# Patient Record
Sex: Female | Born: 1945 | Race: White | Hispanic: No | Smoking: Never smoker
Health system: Southern US, Community
[De-identification: ages and names within clinical notes are randomized; demographics above are authoritative.]

## PROBLEM LIST (undated history)

## (undated) DIAGNOSIS — C801 Malignant (primary) neoplasm, unspecified: Secondary | ICD-10-CM

---

## 2010-08-03 DIAGNOSIS — C8218 Follicular lymphoma grade II, lymph nodes of multiple sites: Secondary | ICD-10-CM | POA: Insufficient documentation

## 2010-08-08 ENCOUNTER — Other Ambulatory Visit (HOSPITAL_COMMUNITY)
Admission: RE | Admit: 2010-08-08 | Discharge: 2010-08-08 | Disposition: A | Discharge status: Home / Self Care | Payer: Medicare Other | Source: Ambulatory Visit | Attending: *Deleted | Admitting: *Deleted

## 2010-08-08 DIAGNOSIS — R599 Enlarged lymph nodes, unspecified: Secondary | ICD-10-CM | POA: Insufficient documentation

## 2010-08-14 ENCOUNTER — Other Ambulatory Visit (HOSPITAL_COMMUNITY): Payer: Self-pay | Admitting: Oncology

## 2010-08-14 DIAGNOSIS — C8590 Non-Hodgkin lymphoma, unspecified, unspecified site: Secondary | ICD-10-CM

## 2010-08-16 ENCOUNTER — Other Ambulatory Visit: Payer: Self-pay | Admitting: Oncology

## 2010-08-16 ENCOUNTER — Other Ambulatory Visit (HOSPITAL_COMMUNITY)
Admission: RE | Admit: 2010-08-16 | Discharge: 2010-08-16 | Disposition: A | Discharge status: Home / Self Care | Payer: Medicare Other | Source: Ambulatory Visit | Attending: Oncology | Admitting: Oncology

## 2010-08-16 DIAGNOSIS — C8589 Other specified types of non-Hodgkin lymphoma, extranodal and solid organ sites: Secondary | ICD-10-CM | POA: Insufficient documentation

## 2010-08-22 ENCOUNTER — Encounter (HOSPITAL_COMMUNITY)
Admission: RE | Admit: 2010-08-22 | Discharge: 2010-08-22 | Disposition: A | Discharge status: Home / Self Care | Payer: Medicare Other | Source: Ambulatory Visit | Attending: Oncology | Admitting: Oncology

## 2010-08-22 ENCOUNTER — Encounter (HOSPITAL_COMMUNITY): Payer: Self-pay

## 2010-08-22 DIAGNOSIS — J9383 Other pneumothorax: Secondary | ICD-10-CM | POA: Insufficient documentation

## 2010-08-22 DIAGNOSIS — R599 Enlarged lymph nodes, unspecified: Secondary | ICD-10-CM | POA: Insufficient documentation

## 2010-08-22 DIAGNOSIS — C8589 Other specified types of non-Hodgkin lymphoma, extranodal and solid organ sites: Secondary | ICD-10-CM | POA: Insufficient documentation

## 2010-08-22 DIAGNOSIS — Z79899 Other long term (current) drug therapy: Secondary | ICD-10-CM | POA: Insufficient documentation

## 2010-08-22 DIAGNOSIS — C8590 Non-Hodgkin lymphoma, unspecified, unspecified site: Secondary | ICD-10-CM

## 2010-08-22 HISTORY — DX: Malignant (primary) neoplasm, unspecified: C80.1

## 2010-08-22 LAB — GLUCOSE, CAPILLARY: Glucose-Capillary: 99 mg/dL (ref 70–99)

## 2010-08-22 MED ORDER — FLUDEOXYGLUCOSE F - 18 (FDG) INJECTION
18.0000 | Freq: Once | INTRAVENOUS | Status: AC | PRN
  Administered 2010-08-22: 18 via INTRAVENOUS

## 2014-10-07 ENCOUNTER — Other Ambulatory Visit: Payer: Self-pay

## 2015-07-29 DIAGNOSIS — C8218 Follicular lymphoma grade II, lymph nodes of multiple sites: Secondary | ICD-10-CM | POA: Diagnosis not present

## 2016-01-05 DIAGNOSIS — Z9889 Other specified postprocedural states: Secondary | ICD-10-CM

## 2016-01-05 DIAGNOSIS — I472 Ventricular tachycardia: Secondary | ICD-10-CM

## 2016-01-05 DIAGNOSIS — R531 Weakness: Secondary | ICD-10-CM

## 2016-01-05 DIAGNOSIS — J690 Pneumonitis due to inhalation of food and vomit: Secondary | ICD-10-CM | POA: Diagnosis not present

## 2016-01-05 DIAGNOSIS — E860 Dehydration: Secondary | ICD-10-CM

## 2016-01-05 DIAGNOSIS — R740 Nonspecific elevation of levels of transaminase and lactic acid dehydrogenase [LDH]: Secondary | ICD-10-CM

## 2016-01-06 DIAGNOSIS — E860 Dehydration: Secondary | ICD-10-CM | POA: Diagnosis not present

## 2016-01-06 DIAGNOSIS — J690 Pneumonitis due to inhalation of food and vomit: Secondary | ICD-10-CM | POA: Diagnosis not present

## 2016-01-06 DIAGNOSIS — R531 Weakness: Secondary | ICD-10-CM | POA: Diagnosis not present

## 2016-01-06 DIAGNOSIS — Z9889 Other specified postprocedural states: Secondary | ICD-10-CM | POA: Diagnosis not present

## 2016-01-07 DIAGNOSIS — D649 Anemia, unspecified: Secondary | ICD-10-CM

## 2016-01-07 DIAGNOSIS — E860 Dehydration: Secondary | ICD-10-CM | POA: Diagnosis not present

## 2016-01-07 DIAGNOSIS — J690 Pneumonitis due to inhalation of food and vomit: Secondary | ICD-10-CM | POA: Diagnosis not present

## 2016-01-07 DIAGNOSIS — Z9889 Other specified postprocedural states: Secondary | ICD-10-CM | POA: Diagnosis not present

## 2016-01-07 DIAGNOSIS — R531 Weakness: Secondary | ICD-10-CM | POA: Diagnosis not present

## 2016-01-08 DIAGNOSIS — R531 Weakness: Secondary | ICD-10-CM | POA: Diagnosis not present

## 2016-01-08 DIAGNOSIS — E860 Dehydration: Secondary | ICD-10-CM | POA: Diagnosis not present

## 2016-01-08 DIAGNOSIS — J690 Pneumonitis due to inhalation of food and vomit: Secondary | ICD-10-CM | POA: Diagnosis not present

## 2016-01-08 DIAGNOSIS — Z9889 Other specified postprocedural states: Secondary | ICD-10-CM | POA: Diagnosis not present

## 2016-01-09 DIAGNOSIS — E860 Dehydration: Secondary | ICD-10-CM | POA: Diagnosis not present

## 2016-01-09 DIAGNOSIS — Z9889 Other specified postprocedural states: Secondary | ICD-10-CM | POA: Diagnosis not present

## 2016-01-09 DIAGNOSIS — R531 Weakness: Secondary | ICD-10-CM | POA: Diagnosis not present

## 2016-01-09 DIAGNOSIS — J690 Pneumonitis due to inhalation of food and vomit: Secondary | ICD-10-CM | POA: Diagnosis not present

## 2016-02-18 DIAGNOSIS — R531 Weakness: Secondary | ICD-10-CM

## 2016-02-18 DIAGNOSIS — M4646 Discitis, unspecified, lumbar region: Secondary | ICD-10-CM | POA: Diagnosis not present

## 2016-02-18 DIAGNOSIS — M4626 Osteomyelitis of vertebra, lumbar region: Secondary | ICD-10-CM | POA: Diagnosis not present

## 2016-02-18 DIAGNOSIS — D649 Anemia, unspecified: Secondary | ICD-10-CM | POA: Diagnosis not present

## 2016-02-19 DIAGNOSIS — M4646 Discitis, unspecified, lumbar region: Secondary | ICD-10-CM | POA: Diagnosis not present

## 2016-02-19 DIAGNOSIS — M4626 Osteomyelitis of vertebra, lumbar region: Secondary | ICD-10-CM | POA: Diagnosis not present

## 2016-02-19 DIAGNOSIS — D649 Anemia, unspecified: Secondary | ICD-10-CM | POA: Diagnosis not present

## 2016-02-19 DIAGNOSIS — R531 Weakness: Secondary | ICD-10-CM | POA: Diagnosis not present

## 2016-02-20 DIAGNOSIS — R531 Weakness: Secondary | ICD-10-CM | POA: Diagnosis not present

## 2016-02-20 DIAGNOSIS — D649 Anemia, unspecified: Secondary | ICD-10-CM | POA: Diagnosis not present

## 2016-02-20 DIAGNOSIS — M4626 Osteomyelitis of vertebra, lumbar region: Secondary | ICD-10-CM | POA: Diagnosis not present

## 2016-02-20 DIAGNOSIS — M4646 Discitis, unspecified, lumbar region: Secondary | ICD-10-CM | POA: Diagnosis not present

## 2016-02-21 DIAGNOSIS — D649 Anemia, unspecified: Secondary | ICD-10-CM | POA: Diagnosis not present

## 2016-02-21 DIAGNOSIS — M4626 Osteomyelitis of vertebra, lumbar region: Secondary | ICD-10-CM | POA: Diagnosis not present

## 2016-02-21 DIAGNOSIS — M4646 Discitis, unspecified, lumbar region: Secondary | ICD-10-CM | POA: Diagnosis not present

## 2016-02-21 DIAGNOSIS — R531 Weakness: Secondary | ICD-10-CM | POA: Diagnosis not present

## 2016-02-22 DIAGNOSIS — F329 Major depressive disorder, single episode, unspecified: Secondary | ICD-10-CM

## 2016-02-22 DIAGNOSIS — E039 Hypothyroidism, unspecified: Secondary | ICD-10-CM

## 2016-02-22 DIAGNOSIS — F419 Anxiety disorder, unspecified: Secondary | ICD-10-CM

## 2016-02-22 DIAGNOSIS — I10 Essential (primary) hypertension: Secondary | ICD-10-CM

## 2016-02-22 DIAGNOSIS — M4646 Discitis, unspecified, lumbar region: Secondary | ICD-10-CM

## 2016-02-22 DIAGNOSIS — G3184 Mild cognitive impairment, so stated: Secondary | ICD-10-CM

## 2016-02-22 DIAGNOSIS — D649 Anemia, unspecified: Secondary | ICD-10-CM

## 2016-02-22 DIAGNOSIS — M4626 Osteomyelitis of vertebra, lumbar region: Secondary | ICD-10-CM

## 2016-02-22 DIAGNOSIS — N179 Acute kidney failure, unspecified: Secondary | ICD-10-CM

## 2016-02-22 DIAGNOSIS — Z9889 Other specified postprocedural states: Secondary | ICD-10-CM

## 2016-02-22 DIAGNOSIS — R5381 Other malaise: Secondary | ICD-10-CM

## 2016-02-22 DIAGNOSIS — E785 Hyperlipidemia, unspecified: Secondary | ICD-10-CM

## 2016-02-23 DIAGNOSIS — M4626 Osteomyelitis of vertebra, lumbar region: Secondary | ICD-10-CM | POA: Diagnosis not present

## 2016-02-23 DIAGNOSIS — M4646 Discitis, unspecified, lumbar region: Secondary | ICD-10-CM | POA: Diagnosis not present

## 2016-02-23 DIAGNOSIS — F419 Anxiety disorder, unspecified: Secondary | ICD-10-CM | POA: Diagnosis not present

## 2016-02-23 DIAGNOSIS — F329 Major depressive disorder, single episode, unspecified: Secondary | ICD-10-CM | POA: Diagnosis not present

## 2016-03-27 ENCOUNTER — Encounter: Payer: Self-pay | Admitting: Internal Medicine

## 2016-03-27 ENCOUNTER — Ambulatory Visit (INDEPENDENT_AMBULATORY_CARE_PROVIDER_SITE_OTHER): Payer: Medicare Other | Admitting: Internal Medicine

## 2016-03-27 VITALS — BP 149/70 | HR 68

## 2016-03-27 DIAGNOSIS — M4646 Discitis, unspecified, lumbar region: Secondary | ICD-10-CM | POA: Diagnosis present

## 2016-03-27 LAB — CBC WITH DIFFERENTIAL/PLATELET
BASOS PCT: 0 %
Basophils Absolute: 0 cells/uL (ref 0–200)
Eosinophils Absolute: 171 cells/uL (ref 15–500)
Eosinophils Relative: 3 %
HCT: 30.4 % — ABNORMAL LOW (ref 35.0–45.0)
Hemoglobin: 10 g/dL — ABNORMAL LOW (ref 11.7–15.5)
LYMPHS PCT: 27 %
Lymphs Abs: 1539 cells/uL (ref 850–3900)
MCH: 31.1 pg (ref 27.0–33.0)
MCHC: 32.9 g/dL (ref 32.0–36.0)
MCV: 94.4 fL (ref 80.0–100.0)
MONOS PCT: 13 %
MPV: 9.9 fL (ref 7.5–12.5)
Monocytes Absolute: 741 cells/uL (ref 200–950)
NEUTROS PCT: 57 %
Neutro Abs: 3249 cells/uL (ref 1500–7800)
PLATELETS: 194 10*3/uL (ref 140–400)
RBC: 3.22 MIL/uL — AB (ref 3.80–5.10)
RDW: 14 % (ref 11.0–15.0)
WBC: 5.7 10*3/uL (ref 3.8–10.8)

## 2016-03-27 LAB — BASIC METABOLIC PANEL
BUN: 28 mg/dL — ABNORMAL HIGH (ref 7–25)
CHLORIDE: 96 mmol/L — AB (ref 98–110)
CO2: 33 mmol/L — AB (ref 20–31)
Calcium: 9.1 mg/dL (ref 8.6–10.4)
Creat: 1.63 mg/dL — ABNORMAL HIGH (ref 0.60–0.93)
GLUCOSE: 111 mg/dL — AB (ref 65–99)
POTASSIUM: 3.6 mmol/L (ref 3.5–5.3)
SODIUM: 137 mmol/L (ref 135–146)

## 2016-03-27 NOTE — Patient Instructions (Signed)
Please have the facility continue with your IV vancomycin and IV cefepime (renally dosed per protocol) until Dec 4th, to finish out 8 wk of Iv therapy. Then switch to doxycycline 100mg  BID. Continue with weekly labs with kidney function, vanco trough 15-20.  Will see you back in 4 wk

## 2016-03-27 NOTE — Progress Notes (Signed)
    Patient ID: Sharon Griffin, female   DOB: 03-09-1946, 70 y.o.   MRN: [id number]  HPI Sharon Griffin is a 70yo F with hx of NHL in remission since 2014, hx of HTN, hypothyroidism, and depressionwho underwent l4-l5 alminectomy on 7/24 that was initially uneventful, then readmitted on 10/08 for discitis presented as bilateral leg pain 70 with associated weakness. She was seen with mid September for eval of back pain by dr. Donivan Scull, where she was given pain meds. She was noted to have frequent falls secondary to lower extremity weakness. She was admitted on  October 10th for evaluatoin, she had xray that showed signs of diskitis on xray. MRI showed phlegmonous inflammation of the ventral epidural space but no enhancing epidural abscess. Ir guided biopsy on 10/9, started on vanco and cefepime empirically. cx were negative. Physical exam on admit showed 2/5 lower extremity strength with bilateral clonus  10/7 sed rate of 59, crp 9.3, cr 1.6(Bl 1.0)  She was discharged on 6 wks of vancomycin and cefepime to treat discitis.    No outpatient encounter prescriptions on file as of 03/27/2016.   No facility-administered encounter medications on file as of 03/27/2016.      There are no active problems to display for this patient.    Health Maintenance Due  Topic Date Due  . Hepatitis C Screening  1945/12/05  . TETANUS/TDAP  05/19/1964  . MAMMOGRAM  05/20/1995  . COLONOSCOPY  05/20/1995  . ZOSTAVAX  05/19/2005  . DEXA SCAN  05/19/2010  . PNA vac Low Risk Adult (1 of 2 - PCV13) 05/19/2010  . INFLUENZA VACCINE  12/13/2015     Review of Systems +urinary incontinence Physical Exam   BP (!) 149/70   Pulse 68  Physical Exam  Constitutional:  oriented to person, place, and time. He appears well-developed and well-nourished. No distress.  HENT:  Mouth/Throat: Oropharynx is clear and moist. No oropharyngeal exudate.  Chest wall: right porta cath is non erythematous Cardiovascular: Normal rate,  regular rhythm and normal heart sounds. Exam reveals no gallop and no friction rub.  No murmur heard.  Pulmonary/Chest: Effort normal and breath sounds normal. No respiratory distress. He has no wheezes.  Abdominal: Soft. Bowel sounds are normal. He exhibits no distension. There is no tenderness.  Lymphadenopathy:  no cervical adenopathy.  Neurological: alert and oriented to person, place, and time. 2/5 bilateral strength lower extremities plus myoclonus bilaterally Skin: Skin is warm and dry. No rash noted. No erythema.  Psychiatric: He has a normal mood and affect. His behavior is normal.     Assessment and Plan  Will check labs, continue with iv vanco and cefepime for an additional 2-4 wk and consider changing to oral abtx for suppression if not improved significantly  Will repeat mri of lumbar spine  Refer to nsgy in [place] for 2nd opinion  Spent 45 min with greater than 50% in discussion of management of surgical site infection

## 2016-03-28 LAB — SEDIMENTATION RATE: Sed Rate: 38 mm/hr — ABNORMAL HIGH (ref 0–30)

## 2016-03-28 LAB — C-REACTIVE PROTEIN: CRP: 2.5 mg/L (ref <8.0)

## 2016-03-28 NOTE — Progress Notes (Signed)
Received verbal order from Dr. Baxter Flattery to change MRI order to with and without contrast (03/28/16).

## 2016-04-04 ENCOUNTER — Telehealth: Payer: Self-pay | Admitting: *Deleted

## 2016-04-04 ENCOUNTER — Other Ambulatory Visit: Payer: Self-pay | Admitting: *Deleted

## 2016-04-04 NOTE — Telephone Encounter (Signed)
Called patient and her daughter to advise her that the appointment for her MRI is set for 04/12/16 at 1 pm. She advised she understands and will let the facility know so that they can arrange transportation. Also reminded her of her follow up with Dr Baxter Flattery.

## 2016-04-12 ENCOUNTER — Ambulatory Visit (HOSPITAL_COMMUNITY): Payer: Medicare Other

## 2016-04-13 ENCOUNTER — Ambulatory Visit (HOSPITAL_COMMUNITY)
Admission: RE | Admit: 2016-04-13 | Discharge: 2016-04-13 | Disposition: A | Discharge status: Home / Self Care | Payer: Medicare Other | Source: Ambulatory Visit | Attending: Internal Medicine | Admitting: Internal Medicine

## 2016-04-13 DIAGNOSIS — M4646 Discitis, unspecified, lumbar region: Secondary | ICD-10-CM | POA: Diagnosis present

## 2016-04-13 MED ORDER — GADOBENATE DIMEGLUMINE 529 MG/ML IV SOLN
10.0000 mL | Freq: Once | INTRAVENOUS | Status: AC | PRN
  Administered 2016-04-13: 7 mL via INTRAVENOUS

## 2016-04-19 ENCOUNTER — Telehealth: Payer: Self-pay | Admitting: Internal Medicine

## 2016-04-19 ENCOUNTER — Telehealth: Payer: Self-pay | Admitting: *Deleted

## 2016-04-19 NOTE — Telephone Encounter (Addendum)
RN spoke to Dr. Baxter Flattery.  Provided copy of previous telephone conversation with the patient's daughter.  Dr. Baxter Flattery to call Dr. Katherine Roan about next steps for treatment of the "pus pocket."   Daughter informed to be able to make choices about placement of her mother after discharge from the SNF tomorrow.

## 2016-04-19 NOTE — Telephone Encounter (Signed)
Patient needing shunt placement for hydrocephalus.  Daughter wanting to know when the procedure may be performed with the patient being on oral antibiotics.  The patient is also being discharged from the SNF on Friday, Dec. 8, 2017.  Daughter also trying to make arrangements for care after d/c, ie.,, Assisted Living or home with Home Health.  The patient completed an MRI on Dec. 1st which was read by the neurosurgeon.  The daughter was told that there was a "pus pocket" on the MRI.  The patient is being followed by a neurosurgeon at [facility] in [facility] who would like to discuss this patient with Dr. Baxter Flattery.  The daughter is meeting with the d/c planning staff at the SNF after lunch to day and would like to speak with Dr. Baxter Flattery prior to this meeting.  She is most interested in knowing about how soon the patient can be off the antibiotics so that her mother can have the Shunt Placement surgery. The daughter's phone # is   [phone]  Neurosurgeon contact information: Raye Sorrow, [address]  [place]  [place], [place] [address]  [phone]  [phone] (Fax)

## 2016-04-19 NOTE — Telephone Encounter (Signed)
The patient has had an abnormal repeat MRI of spine likely reflection of her recent infection that she is getting treated for. She has a Publishing rights manager[place] I will discuss with her the findings of the patient's study to decide what are the next steps if patient will need further surgery.  Sharon Griffin's daughter, prefers that if another surgery is required that if be done by a different surgeon than Dr Donivan Scull.

## 2016-04-20 ENCOUNTER — Other Ambulatory Visit: Payer: Self-pay | Admitting: Pharmacist

## 2016-04-20 ENCOUNTER — Encounter: Payer: Self-pay | Admitting: Pharmacist

## 2016-04-20 DIAGNOSIS — M4646 Discitis, unspecified, lumbar region: Secondary | ICD-10-CM

## 2016-04-20 NOTE — Progress Notes (Signed)
Sent in RX for vancomycin and cefepime x 4 more weeks (stop date 05/18/16) to SNF - [facility]. Asked them to please send labs to [phone]. Also placed order for IR drainage of pocket pus and to send for culture and sensitivity.

## 2016-04-21 ENCOUNTER — Other Ambulatory Visit
Admission: RE | Admit: 2016-04-21 | Discharge: 2016-04-21 | Disposition: A | Discharge status: Home / Self Care | Payer: Medicare Other | Source: Ambulatory Visit | Attending: Internal Medicine | Admitting: Internal Medicine

## 2016-04-21 DIAGNOSIS — I10 Essential (primary) hypertension: Secondary | ICD-10-CM | POA: Insufficient documentation

## 2016-04-21 DIAGNOSIS — G3184 Mild cognitive impairment, so stated: Secondary | ICD-10-CM | POA: Insufficient documentation

## 2016-04-21 DIAGNOSIS — F329 Major depressive disorder, single episode, unspecified: Secondary | ICD-10-CM | POA: Insufficient documentation

## 2016-04-21 DIAGNOSIS — D649 Anemia, unspecified: Secondary | ICD-10-CM | POA: Insufficient documentation

## 2016-04-21 DIAGNOSIS — M4626 Osteomyelitis of vertebra, lumbar region: Secondary | ICD-10-CM | POA: Insufficient documentation

## 2016-04-21 DIAGNOSIS — F419 Anxiety disorder, unspecified: Secondary | ICD-10-CM | POA: Insufficient documentation

## 2016-04-21 DIAGNOSIS — G629 Polyneuropathy, unspecified: Secondary | ICD-10-CM | POA: Insufficient documentation

## 2016-04-21 DIAGNOSIS — E7800 Pure hypercholesterolemia, unspecified: Secondary | ICD-10-CM | POA: Insufficient documentation

## 2016-04-21 DIAGNOSIS — E039 Hypothyroidism, unspecified: Secondary | ICD-10-CM | POA: Insufficient documentation

## 2016-04-21 DIAGNOSIS — C8590 Non-Hodgkin lymphoma, unspecified, unspecified site: Secondary | ICD-10-CM | POA: Insufficient documentation

## 2016-04-21 DIAGNOSIS — M4646 Discitis, unspecified, lumbar region: Secondary | ICD-10-CM | POA: Insufficient documentation

## 2016-04-21 LAB — COMPREHENSIVE METABOLIC PANEL
ALT: 16 U/L (ref 14–54)
ANION GAP: 9 (ref 5–15)
AST: 28 U/L (ref 15–41)
Albumin: 3.7 g/dL (ref 3.5–5.0)
Alkaline Phosphatase: 80 U/L (ref 38–126)
BUN: 30 mg/dL — ABNORMAL HIGH (ref 6–20)
CHLORIDE: 100 mmol/L — AB (ref 101–111)
CO2: 30 mmol/L (ref 22–32)
Calcium: 9.4 mg/dL (ref 8.9–10.3)
Creatinine, Ser: 1.62 mg/dL — ABNORMAL HIGH (ref 0.44–1.00)
GFR, EST AFRICAN AMERICAN: 36 mL/min — AB (ref >60)
GFR, EST NON AFRICAN AMERICAN: 31 mL/min — AB (ref >60)
Glucose, Bld: 149 mg/dL — ABNORMAL HIGH (ref 65–99)
POTASSIUM: 3.2 mmol/L — AB (ref 3.5–5.1)
SODIUM: 139 mmol/L (ref 135–145)
Total Bilirubin: 0.6 mg/dL (ref 0.3–1.2)
Total Protein: 6.3 g/dL — ABNORMAL LOW (ref 6.5–8.1)

## 2016-04-24 ENCOUNTER — Telehealth: Payer: Self-pay | Admitting: *Deleted

## 2016-04-24 ENCOUNTER — Other Ambulatory Visit: Payer: Self-pay | Admitting: Internal Medicine

## 2016-04-24 DIAGNOSIS — M4644 Discitis, unspecified, thoracic region: Secondary | ICD-10-CM

## 2016-04-24 DIAGNOSIS — M4646 Discitis, unspecified, lumbar region: Secondary | ICD-10-CM

## 2016-04-24 NOTE — Progress Notes (Signed)
Patient had laminectomy is September that was complicated by post surgical site infection in October. She was treated with prolonged IV abtx without washout. Repeat MRI still shows fluid collection  Second opinion from NSGY,recommended IR to drain fluid collection, repeat cultures and prolonged IV abtx for addn 4 wk to see if improvement  VP shunt has been deferred until this infection is improved  Family does not wish to go back to original surgeon who performed laminectomy with complicated infection.

## 2016-04-24 NOTE — Telephone Encounter (Signed)
Order for [facility] placed for Dr. Frances Maywood appt scheduled.  Message left for [place] at IR Scheduling to return call to [place] at [phone] with appt information.  Pt's daughter, Maceo Pro, will need called with appointment information to transport patient.

## 2016-04-24 NOTE — Telephone Encounter (Signed)
Patient's daughter Maceo Pro called regarding Mom's follow up appt with Dr.  Baxter Flattery for tomorrow. She stated, that needed to be cancelled per Dr. Baxter Flattery and also she was waiting to hear about a spinal tap that the patient is needing. Lorne Skeens, RN received verbal order from Dr. Baxter Flattery for this and daughter is asking to be called at her work [phone] or on her cell [phone] as soon as this is scheduled so that she can arrange transportation for Mom. Myrtis Hopping

## 2016-04-25 ENCOUNTER — Ambulatory Visit: Payer: PRIVATE HEALTH INSURANCE | Admitting: Internal Medicine

## 2016-04-26 NOTE — Addendum Note (Signed)
Addended by: Lorne Skeens D on: 04/26/2016 01:04 PM   Modules accepted: Orders

## 2016-04-26 NOTE — Addendum Note (Signed)
Addended by: Lorne Skeens D on: 04/26/2016 04:03 PM   Modules accepted: Orders

## 2016-04-26 NOTE — Telephone Encounter (Addendum)
Per conversations with Anderson Malta at [facility] IR and GI Drain clinics the patient does not need the [facility] she needs a IR fluoro procedure to drain the possible abscess.  Place corrected order and left Anderson Malta at MD IR a message that this procedure needed to be scheduled as soon as possible.  Waiting on return call from [place] at [facility] IR scheduling.   RN spoke with IR directly at [phone].  Radiologist to review and call back with possible appointment.

## 2016-04-26 NOTE — Telephone Encounter (Addendum)
Patient has been scheduled at [facility] Radiology for [place] Dec.19.  Needs to arrive at 6:15 for 8 AM procedure.  Radiology needs Dr. Baxter Flattery to place the lab orders in [facility] for the aspirate.  RN shared this information with C. Kuppelweiser, Pharmacist.

## 2016-04-26 NOTE — Telephone Encounter (Signed)
Left message for the patient's daughter, Marja Kays.  IR procedure is scheduled for Tues.,Dec. 19, 2017 at 8 AM.  The patient needs to arrive at [facility] at 6:15 AM at the [facility] [place] and then taken to Radiology to be prepped for the procedure.  Length of the procedure unknown.  Patient and family will find out once the patient is in Radiology.

## 2016-04-27 ENCOUNTER — Other Ambulatory Visit: Payer: Self-pay | Admitting: Radiology

## 2016-04-27 ENCOUNTER — Telehealth: Payer: Self-pay | Admitting: *Deleted

## 2016-04-27 NOTE — Telephone Encounter (Signed)
Follow-up questions re: upcoming IR procedure.  Pt's daughter had a question about whether her mother needed to be NPO prior to the IR procedure Tues., Dec. 19.  IR personnel did not indicate that the patient needed to be NPO prior.  Daughter also asked about when the results of the lab tests would be available.  RN advised that this would depend on the type of labs that Dr. Baxter Flattery orders.  The patient's daughter was told by Dr. Baxter Flattery that she would call with results from the IR procedure.  Daughter verbalized understanding.

## 2016-04-30 ENCOUNTER — Other Ambulatory Visit: Payer: Self-pay | Admitting: Radiology

## 2016-05-01 ENCOUNTER — Other Ambulatory Visit: Payer: Self-pay | Admitting: Internal Medicine

## 2016-05-01 ENCOUNTER — Encounter (HOSPITAL_COMMUNITY): Payer: Self-pay

## 2016-05-01 ENCOUNTER — Ambulatory Visit (HOSPITAL_COMMUNITY)
Admission: RE | Admit: 2016-05-01 | Discharge: 2016-05-01 | Disposition: A | Discharge status: Home / Self Care | Payer: Medicare Other | Source: Ambulatory Visit | Attending: Internal Medicine | Admitting: Internal Medicine

## 2016-05-01 DIAGNOSIS — M4646 Discitis, unspecified, lumbar region: Secondary | ICD-10-CM | POA: Insufficient documentation

## 2016-05-01 DIAGNOSIS — T8140XS Infection following a procedure, unspecified, sequela: Secondary | ICD-10-CM

## 2016-05-01 LAB — PROTIME-INR
INR: 1.08
Prothrombin Time: 14 seconds (ref 11.4–15.2)

## 2016-05-01 LAB — APTT: APTT: 28 s (ref 24–36)

## 2016-05-01 LAB — CBC
HCT: 29.7 % — ABNORMAL LOW (ref 36.0–46.0)
HEMOGLOBIN: 10 g/dL — AB (ref 12.0–15.0)
MCH: 31.1 pg (ref 26.0–34.0)
MCHC: 33.7 g/dL (ref 30.0–36.0)
MCV: 92.2 fL (ref 78.0–100.0)
PLATELETS: 163 10*3/uL (ref 150–400)
RBC: 3.22 MIL/uL — AB (ref 3.87–5.11)
RDW: 13.1 % (ref 11.5–15.5)
WBC: 5 10*3/uL (ref 4.0–10.5)

## 2016-05-01 MED ORDER — LIDOCAINE HCL (PF) 1 % IJ SOLN
INTRAMUSCULAR | Status: AC
  Filled 2016-05-01: qty 30

## 2016-05-01 MED ORDER — FENTANYL CITRATE (PF) 100 MCG/2ML IJ SOLN
INTRAMUSCULAR | Status: AC | PRN
  Administered 2016-05-01: 50 ug via INTRAVENOUS

## 2016-05-01 MED ORDER — HEPARIN SOD (PORK) LOCK FLUSH 100 UNIT/ML IV SOLN
500.0000 [IU] | INTRAVENOUS | Status: AC | PRN
Start: 2016-05-01 — End: 2016-05-01
  Administered 2016-05-01: 500 [IU]

## 2016-05-01 MED ORDER — MIDAZOLAM HCL 2 MG/2ML IJ SOLN
INTRAMUSCULAR | Status: AC
  Filled 2016-05-01: qty 2

## 2016-05-01 MED ORDER — MIDAZOLAM HCL 2 MG/2ML IJ SOLN
INTRAMUSCULAR | Status: AC | PRN
  Administered 2016-05-01: 1 mg via INTRAVENOUS

## 2016-05-01 MED ORDER — HEPARIN SOD (PORK) LOCK FLUSH 100 UNIT/ML IV SOLN
INTRAVENOUS | Status: AC
  Filled 2016-05-01: qty 5

## 2016-05-01 MED ORDER — FENTANYL CITRATE (PF) 100 MCG/2ML IJ SOLN
INTRAMUSCULAR | Status: AC
  Filled 2016-05-01: qty 2

## 2016-05-01 MED ORDER — SODIUM CHLORIDE 0.9 % IV SOLN: INTRAVENOUS | Status: DC

## 2016-05-01 NOTE — Discharge Instructions (Signed)
Needle Biopsy, Care After °Introduction °Refer to this sheet in the next few weeks. These instructions provide you with information about caring for yourself after your procedure. Your health care provider may also give you more specific instructions. Your treatment has been planned according to current medical practices, but problems sometimes occur. Call your health care provider if you have any problems or questions after your procedure. °What can I expect after the procedure? °After your procedure, it is common to have soreness, bruising, or mild pain at the biopsy site. This should go away in a few days. °Follow these instructions at home: °· Rest as directed by your health care provider. °· Take medicines only as directed by your health care provider. °· There are many different ways to close and cover the biopsy site, including stitches (sutures), skin glue, and adhesive strips. Follow your health care provider's instructions about: °¨ Biopsy site care. °¨ Bandage (dressing) changes and removal. °¨ Biopsy site closure removal. °· Check your biopsy site every day for signs of infection. Watch for: °¨ Redness, swelling, or pain. °¨ Fluid, blood, or pus. °Contact a health care provider if: °· You have a fever. °· You have redness, swelling, or pain at the biopsy site that lasts longer than a few days. °· You have fluid, blood, or pus coming from the biopsy site. °· You feel nauseous. °· You vomit. °Get help right away if: °· You have shortness of breath. °· You have trouble breathing. °· You have chest pain. °· You feel dizzy or you faint. °· You have bleeding that does not stop with pressure or a bandage. °· You cough up blood. °· You have pain in your abdomen. °This information is not intended to replace advice given to you by your health care provider. Make sure you discuss any questions you have with your health care provider. °Document Released: 09/14/2014 Document Revised: 10/06/2015 Document Reviewed:  04/26/2014 °© 2017 Elsevier ° °

## 2016-05-01 NOTE — Sedation Documentation (Signed)
Patient denies pain and is resting comfortably.  

## 2016-05-01 NOTE — Progress Notes (Signed)
Called and spoke with Lanell Matar, RN at [facility] in [place] to give report on patient.  Patient arrived to facility with portacath accessed.  Also informed Stacy, RN the patient was flushed 42mL of normal saline and 57mL of heparin 100 units/mL per protocol.  Portacath was left accessed per Marzetta Board, RN at [facility] for antibiotic infusion later today.

## 2016-05-01 NOTE — Sedation Documentation (Signed)
Patient is resting comfortably. 

## 2016-05-01 NOTE — Procedures (Signed)
Dorsal L4-5 postop small fld collection  S/P CT ASPIRATION  No comp Stable 2 cc thin blood tinged fld aspirated Full report in PACS

## 2016-05-01 NOTE — H&P (Signed)
Chief Complaint: Lumbar discitis/fluid collection  Referring Physician(s): [place]  Supervising Physician: Daryll Brod  Patient Status: [facility] - Out-pt  History of Present Illness: Sharon Griffin is a 70 y.o. female who had lumbar surgery back in July of this year.  This was complicated by discitis and fluid collections at the surgical site.  CT scan done 04/13/16 to follow this area showed Little change in the appearance of L4-5 diskitis with L4 and L5 osteomyelitis, except for some decrease in the enhancing ventral epidural phlegmon. Endplate destruction continues, worse at L4.  Dorsal fluid collections at the surgical site extending from the laminotomy into the subcutaneous soft tissues demonstrate increasingly well demarcated peripheral enhancement which is nonspecific. Considerations include seroma, CSF leak, or developing abscess.  She is here today for aspiration/drainage of the fluid collection.  She is NPO. She does not take blood thinners. She denies fever/chills.  Past Medical History:  Diagnosis Date  . Cancer [facility])     History reviewed. No pertinent surgical history.  Allergies: Codeine  Medications: Prior to Admission medications   Not on File     History reviewed. No pertinent family history.  Social History   Social History  . Marital status: Divorced    Spouse name: N/A  . Number of children: N/A  . Years of education: N/A   Social History Main Topics  . Smoking status: Never Smoker  . Smokeless tobacco: Never Used  . Alcohol use No  . Drug use: No  . Sexual activity: Not Currently   Other Topics Concern  . None   Social History Narrative  . None     Review of Systems: A 12 point ROS discussed  Review of Systems  Constitutional: Negative.   HENT: Negative.   Respiratory: Negative.   Cardiovascular: Negative.   Gastrointestinal: Negative.   Genitourinary: Negative.   Musculoskeletal: Positive for back pain.  Skin:  Negative.   Neurological: Negative.   Hematological: Negative.   Psychiatric/Behavioral: Negative.     Vital Signs: BP 130/67   Pulse 68   Temp 98.6 F (37 C)   Resp 18   Ht 5\' 3"  (1.6 m)   Wt 140 lb (63.5 kg)   SpO2 97%   BMI 24.80 kg/m   Physical Exam  Constitutional: She is oriented to person, place, and time. She appears well-developed and well-nourished.  HENT:  Head: Normocephalic and atraumatic.  Eyes: EOM are normal.  Neck: Normal range of motion.  Cardiovascular: Normal rate, regular rhythm and normal heart sounds.   Pulmonary/Chest: Effort normal and breath sounds normal. No respiratory distress. She has no wheezes.  Abdominal: Soft. She exhibits no distension. There is no tenderness.  Musculoskeletal: Normal range of motion.  Neurological: She is alert and oriented to person, place, and time.  Skin: Skin is warm and dry.  Psychiatric: She has a normal mood and affect. Her behavior is normal. Judgment and thought content normal.  Vitals reviewed.   Mallampati Score:  MD Evaluation Airway: WNL Heart: WNL Abdomen: WNL Chest/ Lungs: WNL ASA  Classification: 2 Mallampati/Airway Score: Two  Imaging: Mr Lumbar Spine W Wo Contrast  Result Date: 04/13/2016 CLINICAL DATA:  Continued surveillance of discitis and osteomyelitis. Previous lumbar spine surgery 12/05/2015. EXAM: MRI LUMBAR SPINE WITHOUT AND WITH CONTRAST TECHNIQUE: Multiplanar and multiecho pulse sequences of the lumbar spine were obtained without and with intravenous contrast. CONTRAST:  60mL MULTIHANCE GADOBENATE DIMEGLUMINE 529 MG/ML IV SOLN COMPARISON:  Most recent MR 02/18/2016. FINDINGS: Segmentation:  5 lumbar type vertebral bodies. Alignment:  Physiologic. Vertebrae: Continued changes of L4 and L5 osteomyelitis, with endplate softening and irregularity, more severely affecting L4. Conus medullaris: Extends to the L1-L2 level and appears normal. Paraspinal and other soft tissues: BILATERAL  paravertebral and LEFT psoas inflammatory change redemonstrated. Dorsal fluid collections at the surgical site, with more well-defined peripheral enhancement, extending from the laminotomy to the subcutaneous soft tissues (image [id number] series 9), could represent seroma, CSF leak, or developing abscess. Disc levels: The L1-2, L2-3, L3-4, and L5-S1 disc spaces remain unremarkable. At L4-5, collapse of the disc space with T2 hyperintensity, and enhancement is redemonstrated consistent with discitis. Posteriorly extruded disc fragments show enhancement, and narrow the subarticular zone potentially resulting in symptomatic BILATERAL L5 nerve root impingement. BILATERAL foraminal narrowing due to a combination of loss of interspace height, disc material, and posterior element hypertrophy potentially affect both L4 nerve roots. Decreasing epidural phlegmon, with no features suggestive of abscess. IMPRESSION: Little change in the appearance of L4-5 diskitis with L4 and L5 osteomyelitis, except for some decrease in the enhancing ventral epidural phlegmon. Endplate destruction continues, worse at L4. Dorsal fluid collections at the surgical site extending from the laminotomy into the subcutaneous soft tissues demonstrate increasingly well demarcated peripheral enhancement which is nonspecific. Considerations include seroma, CSF leak, or developing abscess. Enhancing L4-5 central disc extrusion, along with loss of interspace height contribute to BILATERAL subarticular zone and foraminal zone narrowing, potentially affect the L4 and L5 nerve roots. Electronically Signed   By: Staci Righter M.D.   On: 04/13/2016 09:37    Labs:  CBC:  Recent Labs  03/27/16 1733 05/01/16 0711  WBC 5.7 5.0  HGB 10.0* 10.0*  HCT 30.4* 29.7*  PLT 194 163    COAGS:  Recent Labs  05/01/16 0711  INR 1.08  APTT 28    BMP:  Recent Labs  03/27/16 1733 04/21/16 1430  NA 137 139  K 3.6 3.2*  CL 96* 100*  CO2 33* 30  GLUCOSE  111* 149*  BUN 28* 30*  CALCIUM 9.1 9.4  CREATININE 1.63* 1.62*  GFRNONAA  --  31*  GFRAA  --  36*    LIVER FUNCTION TESTS:  Recent Labs  04/21/16 1430  BILITOT 0.6  AST 28  ALT 16  ALKPHOS 80  PROT 6.3*  ALBUMIN 3.7    TUMOR MARKERS: No results for input(s): AFPTM, CEA, CA199, CHROMGRNA in the last 8760 hours.  Assessment and Plan:  Lumbar fluid collections  Will proceed with CT guided aspiration today by Dr. Annamaria Boots.  Risks and Benefits discussed with the patient including bleeding, infection, damage to adjacent structures, spinal cord damage, paralysis, or even death.  All of the patient's questions were answered, patient is agreeable to proceed. Consent signed and in chart.  Thank you for this interesting consult.  I greatly enjoyed meeting Sharon Griffin and look forward to participating in their care.  A copy of this report was sent to the requesting provider on this date.  Electronically Signed: Murrell Redden PA-C 05/01/2016, 8:14 AM   I spent a total of  30 Minutes in face to face in clinical consultation, greater than 50% of which was counseling/coordinating care for CT aspiration lumbar fluid collection.

## 2016-05-02 ENCOUNTER — Telehealth: Payer: Self-pay | Admitting: *Deleted

## 2016-05-02 NOTE — Telephone Encounter (Signed)
Patient daughter called and advised that her mother will be sent home once the IV antibiotics is done. She wants to know what to do then. She also was told by her Neurosurgeon that we will be doing the follow up MRI and until she is cleared of infection they can not do anything for the patient. She is concerned for the patient to be home as she lives alone and will need 24 hour help. Advised her after speaking with Dr Baxter Flattery that the patient will need to be seen in 4 weeks and can be assessed at that time but until then there is nothing we can do but wait for infection to resolve and she will need to follow up with neurosurgeon as well. She advised she understands but she does not know what to do because her mother can not live on her own and her insurance is running out. She needs the shunt immediately. Reminded her that can not happen until she is clear of infection. The report should be in soon as the aspiration was done yesterday 05/01/16.

## 2016-05-04 ENCOUNTER — Telehealth: Payer: Self-pay | Admitting: Internal Medicine

## 2016-05-04 NOTE — Telephone Encounter (Signed)
Left VM msg for her daughter to let her know that nothing new is growing on culture

## 2016-05-06 LAB — AEROBIC/ANAEROBIC CULTURE (SURGICAL/DEEP WOUND): SPECIAL REQUESTS: NORMAL

## 2016-05-06 LAB — AEROBIC/ANAEROBIC CULTURE W GRAM STAIN (SURGICAL/DEEP WOUND): Culture: NO GROWTH

## 2016-05-09 ENCOUNTER — Telehealth: Payer: Self-pay | Admitting: *Deleted

## 2016-05-09 NOTE — Telephone Encounter (Signed)
[place], daughter, calling for patient update. - final results from lumbar puncture - last dose of IV antibiotics 1/6? Currently receiving via port at rehab center. - will there be a transition to oral antibiotics? - does she need a repeat MRI? How soon after finishing antibiotics? - She needs to schedule shunt placement only AFTER antibiotics are complete.  The patient is unable to live alone, is coming up on the end of her insurance at the rehab facility. Danine estimated that she will be discharged on or around 1/6 due to insurance issues.  Please advise. Landis Gandy, RN

## 2016-05-10 NOTE — Telephone Encounter (Signed)
I left her a voicemail the Friday before holidays about the treatment plan as discussed with her neurosurgeon. I will call again to reiterate the plan

## 2016-05-11 ENCOUNTER — Telehealth: Payer: Self-pay | Admitting: *Deleted

## 2016-05-11 ENCOUNTER — Other Ambulatory Visit: Payer: Self-pay | Admitting: Internal Medicine

## 2016-05-11 DIAGNOSIS — M869 Osteomyelitis, unspecified: Principal | ICD-10-CM

## 2016-05-11 DIAGNOSIS — M4626 Osteomyelitis of vertebra, lumbar region: Secondary | ICD-10-CM

## 2016-05-11 NOTE — Telephone Encounter (Addendum)
Verbal order given to Otho Perl, RN at [facility] [place] [phone]) and faxed [phone]). Attempted to call her medicare supplement plan F to see if the upcoming MRI needs prior authorization - had to leave a message for call back.  Spoke with [employer]. They will contact the patient's daughter to schedule the MRI for after 1/6. Landis Gandy, RN   ----- Message from Carlyle Basques, MD sent at 05/11/2016 11:47 AM EST ----- Can you extend IV abtx for 2 wk on [place]. Plus I have placed an MRI order of lumbar spine w and wo contrast to be done after 1/6. This will help [place] figure out how long to get iv abtx

## 2016-05-11 NOTE — Progress Notes (Signed)
We can extend iv abtx 2 more weeks, and get mri the first week of jan

## 2016-05-18 ENCOUNTER — Telehealth: Payer: Self-pay | Admitting: *Deleted

## 2016-05-18 NOTE — Telephone Encounter (Signed)
RN called SNF to follow up on orders faxed 12/29 to extend IV antibiotics an additional 2 weeks starting 1/6.  Per nursing at [facility] [phone]), patient is being transferred to [place] on Monday 1/8.  Per [facility] has all orders, appointments, and has their own home health nursing to complete PICC orders (antibiotics, labs). RN followed up with a call to [facility], spoke with Cathie Beams (nurse intake).  RN made [facility] aware of upcoming MRI and [facility] appointments - they were not aware of these.  [facility] was not aware of the IV antibiotic orders, is unable to provide this care with their in-house nursing and does not allow home health nursing onsite.  Per Cathie Beams, this will have to be administered by the outpatient infusion at [facility].  They will need orders for medication to be administered with dosing, end date, any labs/care to be administered as well as an ICD-10 code for billing.   [facility] [phone]  special procedures [phone], [phone]

## 2016-05-18 NOTE — Telephone Encounter (Signed)
Called [facility] and was advised the patient is set up for home health at their facility. They just need the current dose of IV meds and most recent BMP, weekly labs needed, and office notes. Called patient current facility and had information faxed. Then faxed signed order from [facility] and information requested to [facility] at [phone].   Weekly labs CRP, Sed Rate, Vanc tourgh 2x weekly BMP  Current dosage Vanc 750 mg daily and Cefapime 1 Gram daily renally dosed per pharmacy protocol.  Given Fax [phone] to send lab results and doctor pager for stat calls.

## 2016-05-22 ENCOUNTER — Telehealth: Payer: Self-pay

## 2016-05-22 ENCOUNTER — Other Ambulatory Visit: Payer: PRIVATE HEALTH INSURANCE

## 2016-05-22 NOTE — Telephone Encounter (Signed)
[facility] Nurse is trying to figure out who is monitoring patients Vancomycin.  She is currently in an assisted living facility. Nurse thinks she had labs done at our office on yesterday during her visit.  Patient was not seen at our facility on Monday. She has a scheduled appointment next week and last visit with [facility] was November, 2017.

## 2016-05-23 ENCOUNTER — Ambulatory Visit
Admission: RE | Admit: 2016-05-23 | Discharge: 2016-05-23 | Disposition: A | Discharge status: Home / Self Care | Payer: Medicare Other | Source: Ambulatory Visit | Attending: Internal Medicine | Admitting: Internal Medicine

## 2016-05-23 DIAGNOSIS — M4626 Osteomyelitis of vertebra, lumbar region: Secondary | ICD-10-CM

## 2016-05-23 DIAGNOSIS — M869 Osteomyelitis, unspecified: Principal | ICD-10-CM

## 2016-05-23 MED ORDER — GADOBENATE DIMEGLUMINE 529 MG/ML IV SOLN
7.0000 mL | Freq: Once | INTRAVENOUS | Status: AC | PRN
  Administered 2016-05-23: 7 mL via INTRAVENOUS

## 2016-05-29 ENCOUNTER — Telehealth: Payer: Self-pay | Admitting: *Deleted

## 2016-05-29 ENCOUNTER — Ambulatory Visit (INDEPENDENT_AMBULATORY_CARE_PROVIDER_SITE_OTHER): Payer: Medicare Other | Admitting: Internal Medicine

## 2016-05-29 ENCOUNTER — Encounter: Payer: Self-pay | Admitting: Internal Medicine

## 2016-05-29 VITALS — BP 123/70 | HR 70 | Temp 98.3°F | Ht 63.0 in | Wt 135.0 lb

## 2016-05-29 DIAGNOSIS — M4645 Discitis, unspecified, thoracolumbar region: Secondary | ICD-10-CM | POA: Diagnosis present

## 2016-05-29 LAB — CBC WITH DIFFERENTIAL/PLATELET
BASOS PCT: 0 %
Basophils Absolute: 0 cells/uL (ref 0–200)
EOS PCT: 3 %
Eosinophils Absolute: 150 cells/uL (ref 15–500)
HCT: 32.2 % — ABNORMAL LOW (ref 35.0–45.0)
Hemoglobin: 10.7 g/dL — ABNORMAL LOW (ref 11.7–15.5)
Lymphocytes Relative: 35 %
Lymphs Abs: 1750 cells/uL (ref 850–3900)
MCH: 31 pg (ref 27.0–33.0)
MCHC: 33.2 g/dL (ref 32.0–36.0)
MCV: 93.3 fL (ref 80.0–100.0)
MONOS PCT: 11 %
MPV: 9.8 fL (ref 7.5–12.5)
Monocytes Absolute: 550 cells/uL (ref 200–950)
NEUTROS ABS: 2550 {cells}/uL (ref 1500–7800)
Neutrophils Relative %: 51 %
PLATELETS: 187 10*3/uL (ref 140–400)
RBC: 3.45 MIL/uL — ABNORMAL LOW (ref 3.80–5.10)
RDW: 14.2 % (ref 11.0–15.0)
WBC: 5 10*3/uL (ref 3.8–10.8)

## 2016-05-29 LAB — BASIC METABOLIC PANEL
BUN: 26 mg/dL — AB (ref 7–25)
CHLORIDE: 96 mmol/L — AB (ref 98–110)
CO2: 33 mmol/L — AB (ref 20–31)
Calcium: 9.6 mg/dL (ref 8.6–10.4)
Creat: 1.85 mg/dL — ABNORMAL HIGH (ref 0.60–0.93)
GLUCOSE: 91 mg/dL (ref 65–99)
POTASSIUM: 3.7 mmol/L (ref 3.5–5.3)
SODIUM: 140 mmol/L (ref 135–146)

## 2016-05-29 NOTE — Telephone Encounter (Signed)
Call from Gilman Buttner with home health regarding patient's stop date for antibiotics. Call back # [phone]. Myrtis Hopping

## 2016-05-29 NOTE — Patient Instructions (Signed)
We will refer you to see Dr Salomon Fick, neurosurgery at [facility]

## 2016-05-29 NOTE — Progress Notes (Signed)
Rfv: follow up for discitis, post surgical site infection  Patient ID: Sharon Griffin, female   DOB: Jun 28, 1945, 71 y.o.   MRN: [id number]  HPI 71yo F with hx of NHL in remission since 2014, hx of HTN, hypothyroidism, and depression who underwent L4-L5 laminectomy on 7/24 that was initially uneventful. She was seen with mid September for eval of back pain by dr. Michela Pitcher surgeon who did the original surgery, where she was given pain meds. She was noted to have frequent falls secondary to lower extremity weakness.She was then readmitted on 10/08 for discitis presented as bilateral leg pain with associated weakness. she had xrays at that showed Griffin of diskitis on xray. MRI showed phlegmonous inflammation of the ventral epidural space but no enhancing epidural abscess. IR guided biopsy on 10/9, started on vanco and cefepime empirically. cx were negative. In the meantime, she was seen by neurologist for worsening gait, memory and work up for NPH. MRI at that time was done. She did undergo an LP, OP of 24, on 11/21 to see if symptoms improved for which her daughter states her gait improved considerably (as seen by a video clip she showed me). They were then referred to neurosurgeon, dr kilpatrick for evaluation of VP shunt, though she was adamant that she could not do further intervention if she was still being treated for underlying lumbar discitis.  We saw her on 11/14 and were unsure where the status of her treated/untreated infection.   we  repeated imaging in early december that showed still enhancement and fluid collection. In discussion with her neurosurgeon, dr kilpatrick. The plan was to do another course of  IV abtx for 4-6 wk. Aspirated on 12/19 ngtd had no growth todate, only a few WBC. With repeat MRI imaging at the end of course of treatment. She has been receiving vancomycin and cefepime up utnil jan 5th. Difficulty accessing her port through home health center  Repeat mri on 1/10  showed L4-5 discitis/osteomyelitis as being similar to prior study. No furhter collapse of L4. The 76mm nonenchancing fluid collection to the right side of lamina is thought to be either treated infection or chronic hematoma. paraspinous soft tissue enhancement around L4-5 is disc space.   Family reports that she has worsening symptoms of confusion, and gait abnormality which she is unsteady, shuffling magnetic gait requiring assistance now for adls.  Outpatient Encounter Prescriptions as of 05/29/2016  Medication Sig  . atorvastatin (LIPITOR) 10 MG tablet Take 10 mg by mouth every evening.  . calcium-vitamin D (OSCAL WITH D) 500-200 MG-UNIT TABS tablet Take 1 tablet by mouth 2 (two) times daily.  . cholecalciferol (VITAMIN D) 1000 units tablet Take 1,000 Units by mouth at bedtime.  . docusate sodium (COLACE) 100 MG capsule Take 100 mg by mouth every 12 (twelve) hours as needed for mild constipation.  Marland Kitchen escitalopram (LEXAPRO) 20 MG tablet Take 20 mg by mouth daily.  Marland Kitchen gabapentin (NEURONTIN) 100 MG capsule Take 100 mg by mouth 2 (two) times daily.  . hydrochlorothiazide (MICROZIDE) 12.5 MG capsule Take 12.5 mg by mouth daily.  Marland Kitchen levothyroxine (SYNTHROID, LEVOTHROID) 50 MCG tablet Take 50 mcg by mouth daily before breakfast.  . lidocaine-prilocaine (EMLA) cream Apply 1 application topically as needed (for port).  Marland Kitchen lisinopril (PRINIVIL,ZESTRIL) 10 MG tablet Take 10 mg by mouth daily.  . Multiple Vitamins-Calcium (ESSENTIAL ONE DAILY MULTIVIT PO) Take 1 tablet by mouth daily.  . ondansetron (ZOFRAN) 4 MG tablet Take 4 mg by mouth  every 6 (six) hours as needed for nausea or vomiting.  Marland Kitchen oxyCODONE-acetaminophen (PERCOCET/ROXICET) 5-325 MG tablet Take 1 tablet by mouth every 6 (six) hours as needed for severe pain.  . pantoprazole (PROTONIX) 40 MG tablet Take 40 mg by mouth daily.  . traMADol (ULTRAM) 50 MG tablet Take 50 mg by mouth 2 (two) times daily.   No facility-administered encounter  medications on file as of 05/29/2016.      There are no active problems to display for this patient.    Health Maintenance Due  Topic Date Due  . Hepatitis C Screening  1945-12-28  . TETANUS/TDAP  05/19/1964  . MAMMOGRAM  05/20/1995  . COLONOSCOPY  05/20/1995  . ZOSTAVAX  05/19/2005  . DEXA SCAN  05/19/2010  . PNA vac Low Risk Adult (1 of 2 - PCV13) 05/19/2010  . INFLUENZA VACCINE  12/13/2015     Review of Systems + worsening shuffling gait, occ urinary incontinence, overall weakness. No bowel disfunction Physical Exam   BP 123/70   Pulse 70   Temp 98.3 F (36.8 C) (Oral)   Ht 5\' 3"  (1.6 m)   Wt 135 lb (61.2 kg)   BMI 23.91 kg/m    Physical Exam  Constitutional:  oriented to person, place,only. appears well-developed and well-nourished. No distress.  HENT: [place]/AT, PERRLA, no scleral icterus Mouth/Throat: Oropharynx is clear and moist. No oropharyngeal exudate.  Cardiovascular: Normal rate, regular rhythm and normal heart sounds. Exam reveals no gallop and no friction rub.  No murmur heard.  Pulmonary/Chest: Effort normal and breath sounds normal. No respiratory distress.  has no wheezes.  Neck = supple, no nuchal rigidity Abdominal: Soft. Bowel sounds are normal.  exhibits no distension. There is no tenderness.  Lymphadenopathy: no cervical adenopathy. No axillary adenopathy Neurological: alert and oriented to person, place. With assistance she is able to stand and attempts to walk 10 feet with shuffling gait, difficulty with raising feet off the ground, gait shortens with time, almost walking in place. Skin: Skin is warm and dry. No rash noted. No erythema.  Psychiatric: a normal mood and affect.  behavior is normal.   CBC Lab Results  Component Value Date   WBC 5.0 05/01/2016   RBC 3.22 (L) 05/01/2016   HGB 10.0 (L) 05/01/2016   HCT 29.7 (L) 05/01/2016   PLT 163 05/01/2016   MCV 92.2 05/01/2016   MCH 31.1 05/01/2016   MCHC 33.7 05/01/2016   RDW 13.1 05/01/2016    LYMPHSABS 1,539 03/27/2016   MONOABS 741 03/27/2016   EOSABS 171 03/27/2016    BMET Lab Results  Component Value Date   NA 139 04/21/2016   K 3.2 (L) 04/21/2016   CL 100 (L) 04/21/2016   CO2 30 04/21/2016   GLUCOSE 149 (H) 04/21/2016   BUN 30 (H) 04/21/2016   CREATININE 1.62 (H) 04/21/2016   CALCIUM 9.4 04/21/2016   GFRNONAA 31 (L) 04/21/2016   GFRAA 36 (L) 04/21/2016   Lab Results  Component Value Date   ESRSEDRATE 18 05/29/2016   Lab Results  Component Value Date   CRP 0.9 05/29/2016   Imaging: MRI of lumbar spine on 05/23/16 TECHNIQUE: Multiplanar and multiecho pulse sequences of the lumbar spine were obtained without and with intravenous contrast.  CONTRAST:  47mL MULTIHANCE GADOBENATE DIMEGLUMINE 529 MG/ML IV SOLN  COMPARISON:  Lumbar MRI 04/13/2016, 02/18/2016  FINDINGS: Segmentation:  Normal  Alignment:  Normal  Vertebrae: Endplate destruction at L4-5 unchanged. Diffuse bone marrow edema and enhancement L4-5 compatible with  discitis and osteomyelitis. Loss of height of the L4 vertebral body is unchanged. Paraspinous soft tissue edema and enhancement is unchanged. Enhancing tissue dorsal to the disc space at L4-5 also unchanged likely related to phlegmon. No new areas of infection or fracture.  Conus medullaris: Extends to the L1-2 level and appears normal.  Paraspinal and other soft tissues: Paraspinous soft tissue thickening and enhancement at L4-5 unchanged. No psoas abscess. Nonenhancing complex fluid collection posterior to the lamina on the right is unchanged. This has been aspirated and was negative for bacterial growth. Small fluid collection extending to the subcutaneous tissues has improved in the interval. No evidence of epidural abscess.  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 No further collapse of the L4 vertebral body. No epidural abscess  15 mm nonenhancing complex fluid collection posterior to the lamina on the right is stable and may represent chronic hematoma or treated infection. Paraspinous soft tissue thickening enhancement around the L4-5 disc space is stable.     Assessment and Plan  Lumbar discitis = she has received 2 courses of prolonged IV abtx. Initially roughly 6 wk, and followed by 4 wk course, which just ended last week. Her inflammatory markers have normalized. I don't think we need to do further abtx at this time and watch her off of abtx. In regards to her MRI findings, still has unchanged fluid collection/phlegmon that I am concerned if needs any surgical intervention. 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

## 2016-05-30 LAB — SEDIMENTATION RATE: Sed Rate: 18 mm/hr (ref 0–30)

## 2016-05-30 LAB — C-REACTIVE PROTEIN: CRP: 0.9 mg/L (ref <8.0)

## 2016-06-05 ENCOUNTER — Other Ambulatory Visit: Payer: Self-pay | Admitting: Internal Medicine

## 2016-06-05 ENCOUNTER — Telehealth: Payer: Self-pay | Admitting: *Deleted

## 2016-06-05 DIAGNOSIS — Z8739 Personal history of other diseases of the musculoskeletal system and connective tissue: Secondary | ICD-10-CM

## 2016-06-05 DIAGNOSIS — G912 (Idiopathic) normal pressure hydrocephalus: Secondary | ICD-10-CM

## 2016-06-05 NOTE — Telephone Encounter (Signed)
[facility] Nurse called stating they never received an order for oral antibiotic for patient. Advised I would speak to Dr. Baxter Flattery and call her back at [phone].

## 2016-06-05 NOTE — Telephone Encounter (Signed)
Per Dr. Baxter Flattery patient does not need oral antibiotic and home health nurse notified.

## 2016-06-05 NOTE — Progress Notes (Unsigned)
Will refer to [facility]/ to see dr Salomon Fick preferably

## 2016-06-18 ENCOUNTER — Telehealth: Payer: Self-pay

## 2016-06-18 NOTE — Telephone Encounter (Signed)
Note faxed to Dr Katherine Roan  [phone] fax [phone] phone

## 2016-06-18 NOTE — Telephone Encounter (Signed)
Patients daughter, Elon Jester  is calling regarding referral to Dr Katherine Roan.  According to daughter Dr Baxter Flattery was to call Dr.  Katherine Roan  to discuss shunt placement. The patient fell this week end and her condition continues to decline.  I will send office note that refers to condition and referral.  Denine seems to think Dr Katherine Roan wanted to speak with Dr Baxter Flattery.  I will forward message.  Dr Katherine Roan [phone]   Denine : [place]

## 2016-06-19 ENCOUNTER — Ambulatory Visit: Payer: Medicare Other | Admitting: Internal Medicine

## 2016-06-21 NOTE — Telephone Encounter (Signed)
I have left message to call back. I was able to get through to their clinic/left text msg with kilpatrick to discuss [place]

## 2017-03-05 DIAGNOSIS — D649 Anemia, unspecified: Secondary | ICD-10-CM | POA: Diagnosis not present

## 2017-03-05 DIAGNOSIS — N289 Disorder of kidney and ureter, unspecified: Secondary | ICD-10-CM | POA: Diagnosis not present

## 2017-03-05 DIAGNOSIS — C8218 Follicular lymphoma grade II, lymph nodes of multiple sites: Secondary | ICD-10-CM

## 2017-09-02 DIAGNOSIS — D631 Anemia in chronic kidney disease: Secondary | ICD-10-CM | POA: Insufficient documentation

## 2017-09-02 DIAGNOSIS — C8218 Follicular lymphoma grade II, lymph nodes of multiple sites: Secondary | ICD-10-CM

## 2017-09-02 DIAGNOSIS — N189 Chronic kidney disease, unspecified: Secondary | ICD-10-CM | POA: Diagnosis not present

## 2018-01-13 IMAGING — CT CT GUIDANCE NEEDLE PLACEMENT
1 of 2 series · 15 of 32 positions shown, 19 images · non-contrast
Comparison: none

INDICATION: Previous L4-5 lumbar surgery, dorsal L4-5 surgical site small fluid
collection
TECHNIQUE: Informed written consent was obtained from the patient after a
thorough discussion of the procedural risks, benefits and
alternatives. All questions were addressed. Maximal Sterile Barrier
Technique was utilized including caps, mask, sterile gowns, sterile
gloves, sterile drape, hand hygiene and skin antiseptic. A timeout
was performed prior to the initiation of the procedure.

[Series 2: i-spiral 5.0 b40f · axial · 0.59mm/px · z∈[+912,+1062]mm · 15 of 47 slices shown, 19 images]
[im 2/47  soft-tissue]
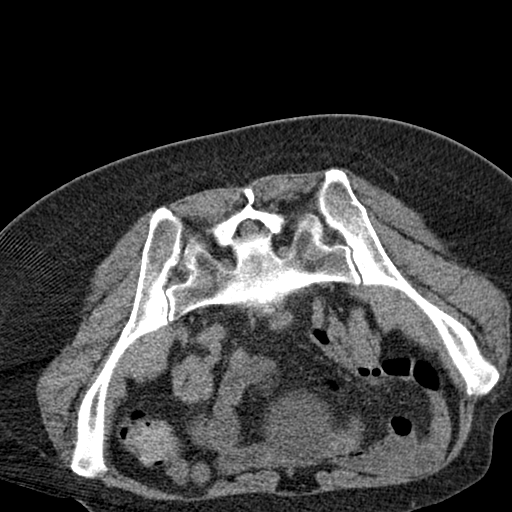
[im 2/47  bone]
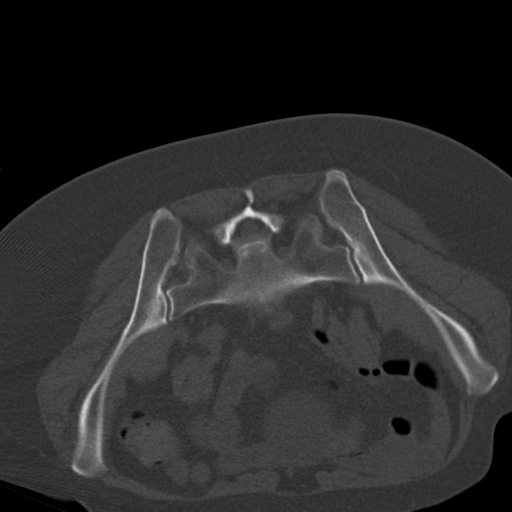
[im 6/47  soft-tissue]
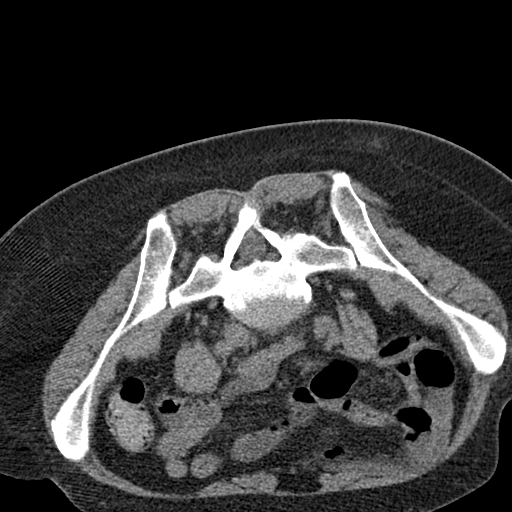
[im 10/47  soft-tissue]
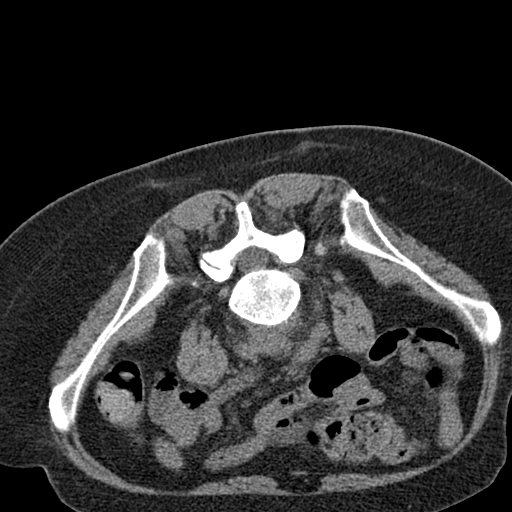
[im 14/47  soft-tissue]
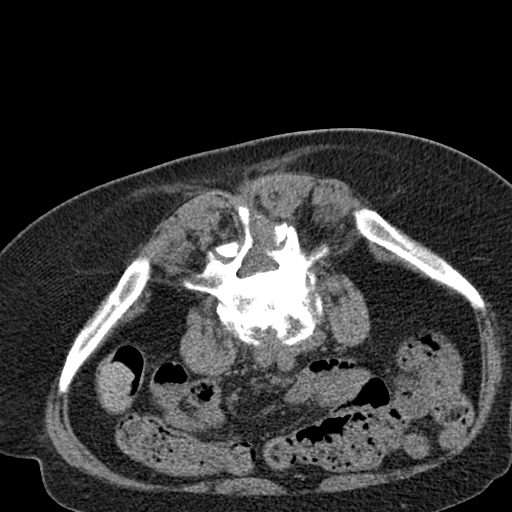
[im 16/47  soft-tissue]
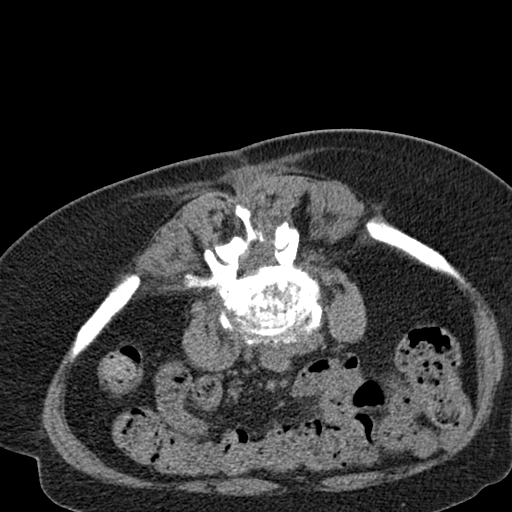
[im 20/47  soft-tissue]
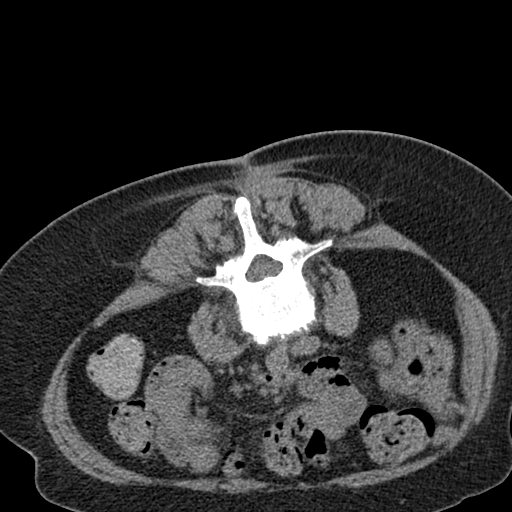
[im 24/47  soft-tissue]
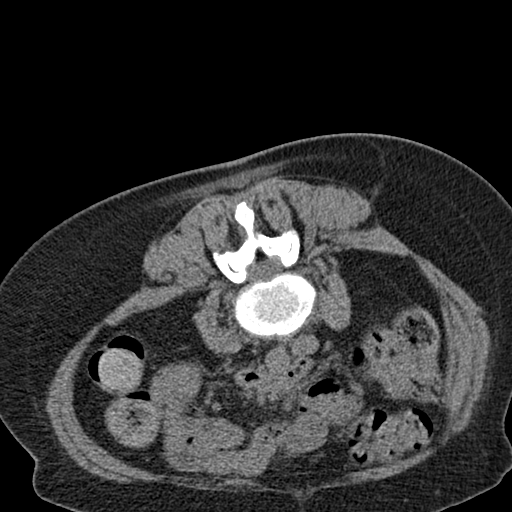
[im 27/47  soft-tissue]
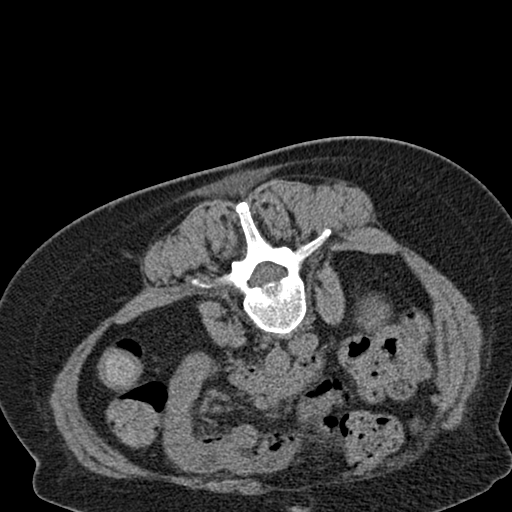
[im 31/47  soft-tissue]
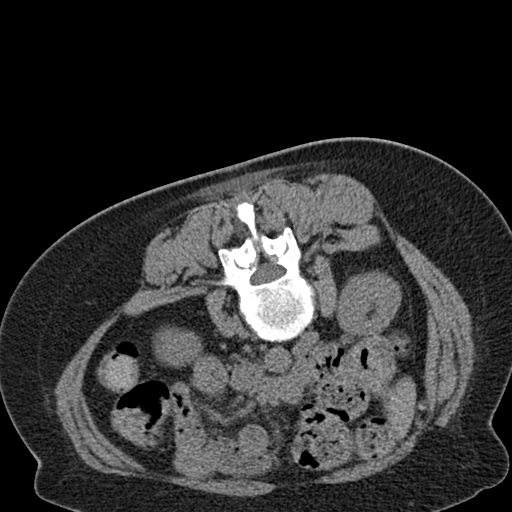
[im 31/47  bone]
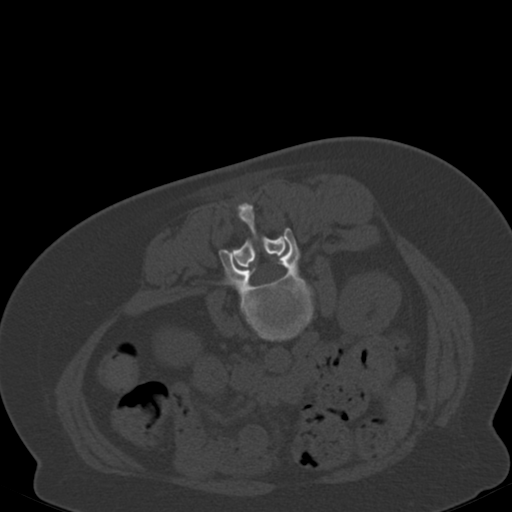
[im 33/47  soft-tissue]
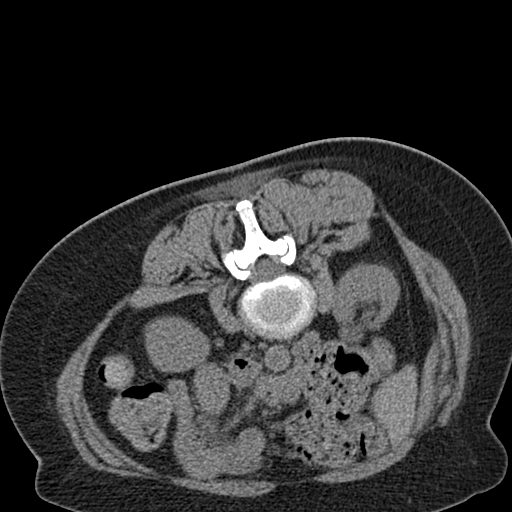
[im 37/47  soft-tissue]
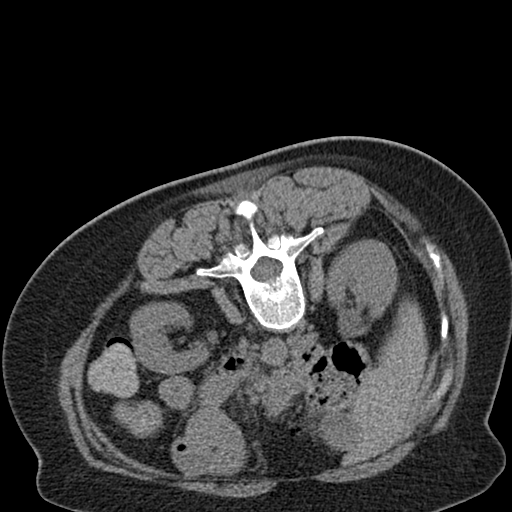
[im 39/47  lung]
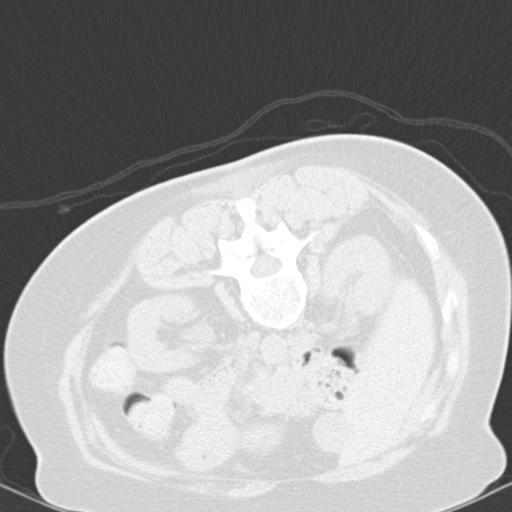
[im 41/47  soft-tissue]
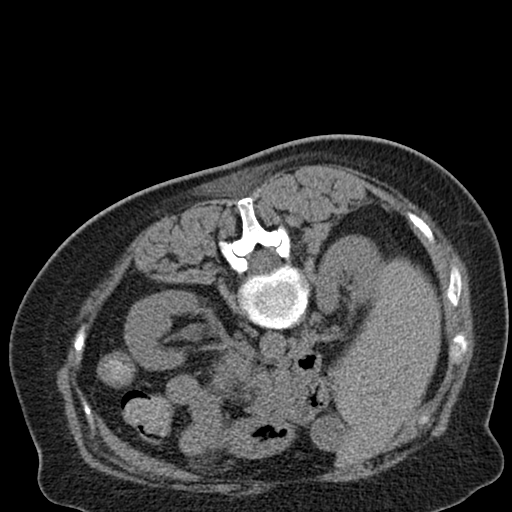
[im 41/47  lung]
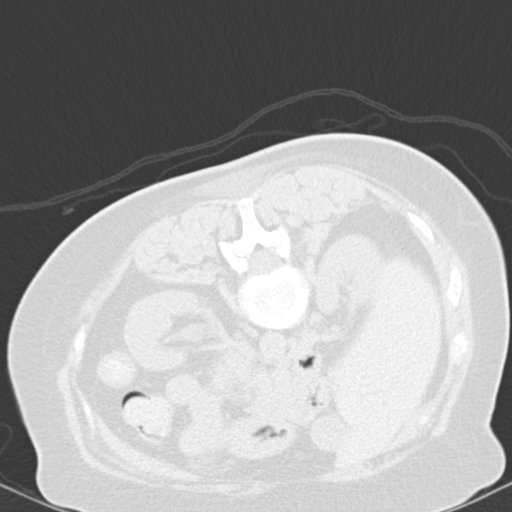
[im 43/47  lung]
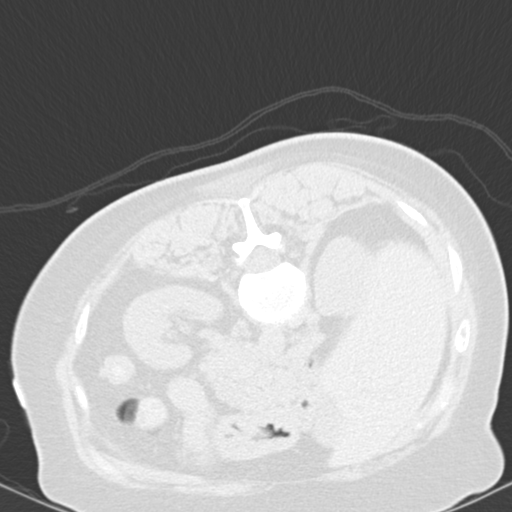
[im 45/47  soft-tissue]
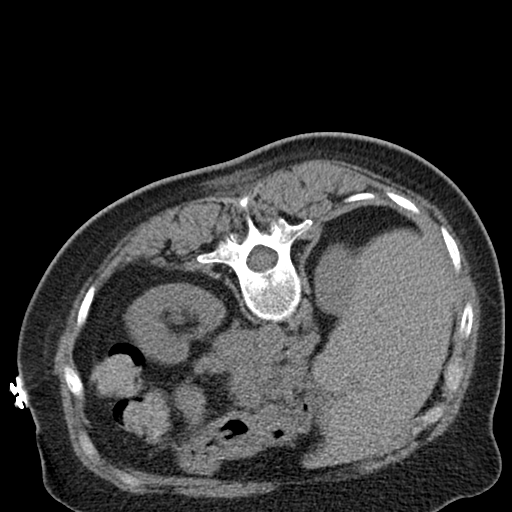
[im 45/47  lung]
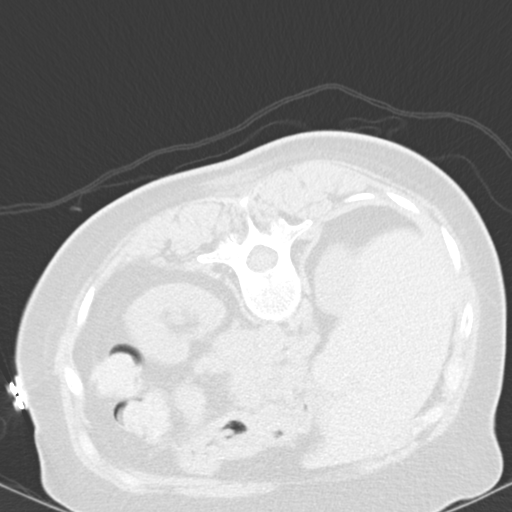

[15 of 32 positions shown; findings below may reference images not displayed]

EXAM:
CT GUIDED ASPIRATION OF THE L4-5 SURGICAL SITE DORSAL FLUID
COLLECTION

MEDICATIONS:
1% LIDOCAINE LOCALLY

ANESTHESIA/SEDATION:
1 mg Versed, 50 mcg fentanyl

Moderate Sedation Time:  10 minutes

The patient was continuously monitored during the procedure by the
interventional radiology nurse under my direct supervision.

COMPLICATIONS:
None immediate.
PROCEDURE:
Previous imaging reviewed. Patient positioned prone. Noncontrast
localization CT performed. The L4-5 surgical site small dorsal fluid
collection was localized. Overlying skin marked.

The posterior back was prepped with Chlorhexidine in a sterile
fashion, and a sterile drape was applied covering the operative
field. A sterile gown and sterile gloves were used for the
procedure. Local anesthesia was provided with 1% Lidocaine.

Under sterile conditions and local anesthesia, CT guidance utilized
to advance an 18 gauge guide needle into the small elongated L4-5
surgical site fluid collection dorsally. Needle position confirmed
with CT. Syringe aspiration yielded 2 cc scant thin bloody fluid. No
purulent component. Sample sent for Gram stain and culture. Needle
removed. Patient tolerated the procedure well. No immediate
complication.
FINDINGS: CT imaging confirms needle placement in the small L4-5 surgical site
dorsal fluid collection for needle aspiration
IMPRESSION: Successful CT-guided L4-5 surgical site dorsal fluid collection
needle aspiration.

## 2018-02-04 IMAGING — MR MR LUMBAR SPINE WO/W CM
4 of 7 series · 25 of 48 positions shown · IV contrast (multihance)
Comparison: Lumbar MRI 04/13/2016, 02/18/2016

CLINICAL DATA: History of post-laminectomy infection. Currently on
antibiotics

EXAM:
MRI LUMBAR SPINE WITHOUT AND WITH CONTRAST
TECHNIQUE: Multiplanar and multiecho pulse sequences of the lumbar spine were
obtained without and with intravenous contrast.
CONTRAST:  7mL MULTIHANCE GADOBENATE DIMEGLUMINE 529 MG/ML IV SOLN

[Series 4: T1 · sagittal · 4.0mm · 0.55mm/px · 5 of 13 slices shown (1 of 2)]
[im 1/13]
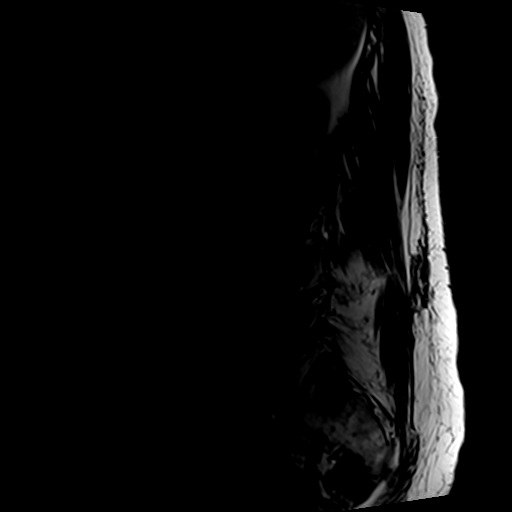
[im 4/13]
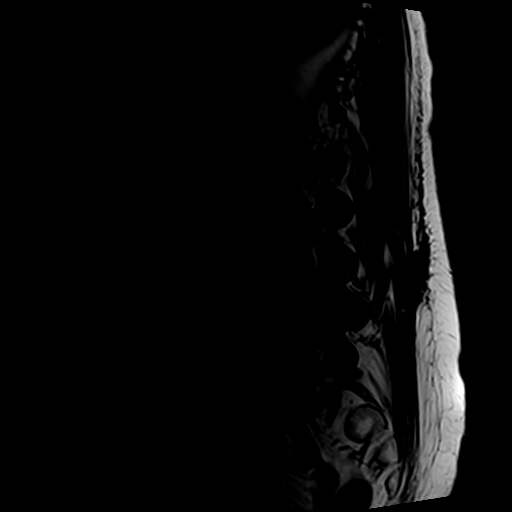
[im 7/13]
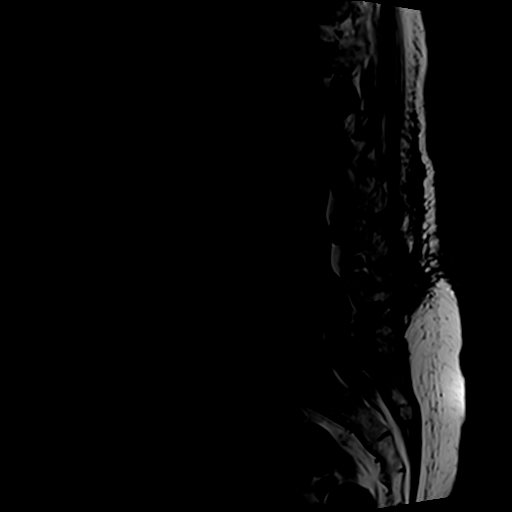
[im 10/13]
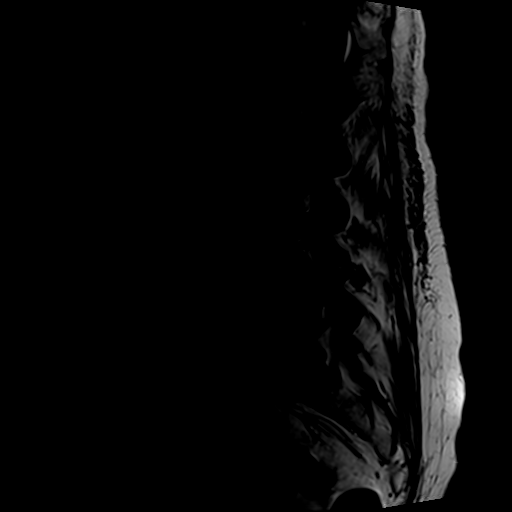
[im 13/13]
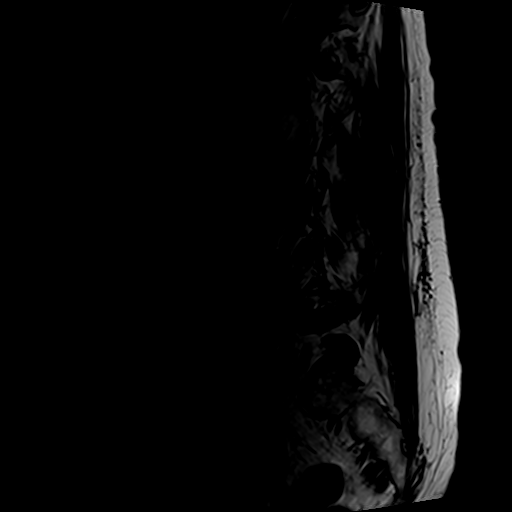

[Series 5: T2 · axial · 4.0mm · 0.70mm/px · z∈[-64,+92]mm · 8 of 32 slices shown]
[im 1/32]
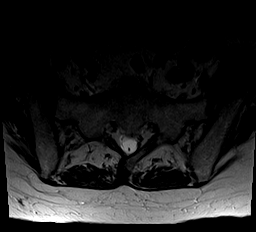
[im 4/32]
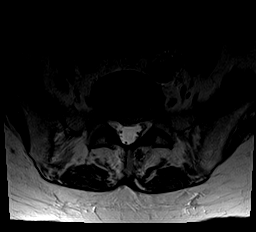
[im 11/32]
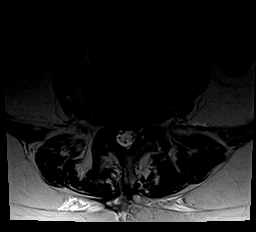
[im 14/32]
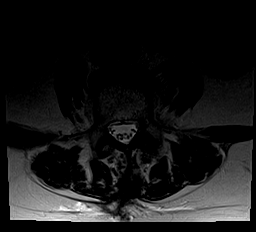
[im 18/32]
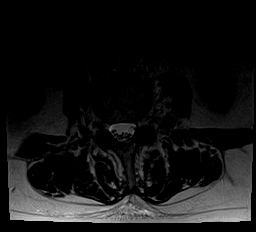
[im 21/32]
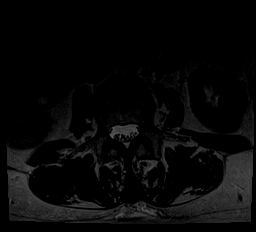
[im 28/32]
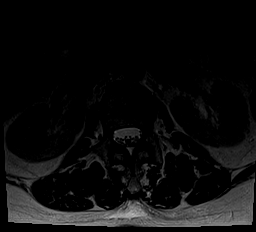
[im 32/32]
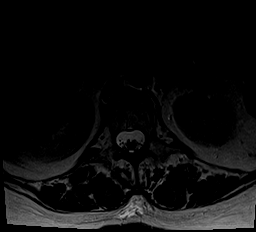

[Series 6: T1 · axial · 4.0mm · 0.35mm/px · z∈[-64,+92]mm · 8 of 32 slices shown (2 of 2)]
[im 1/32]
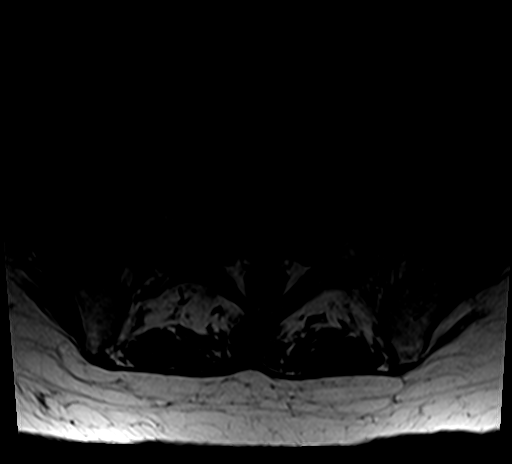
[im 4/32]
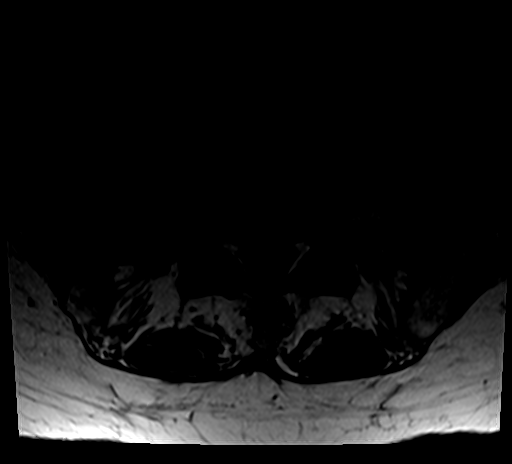
[im 11/32]
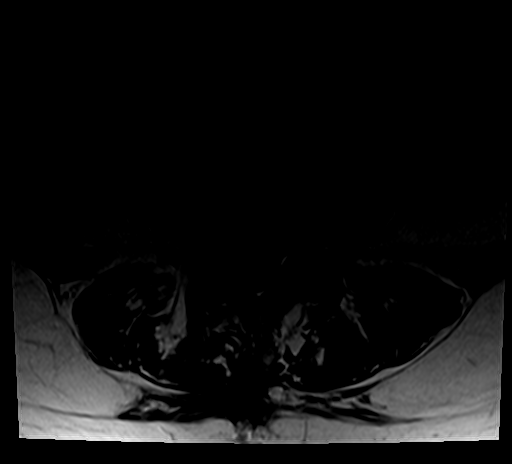
[im 14/32]
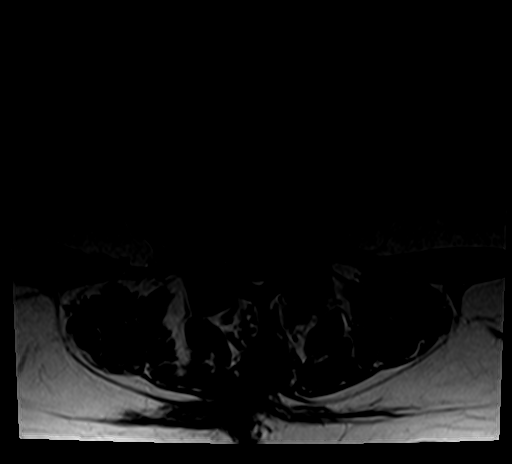
[im 18/32]
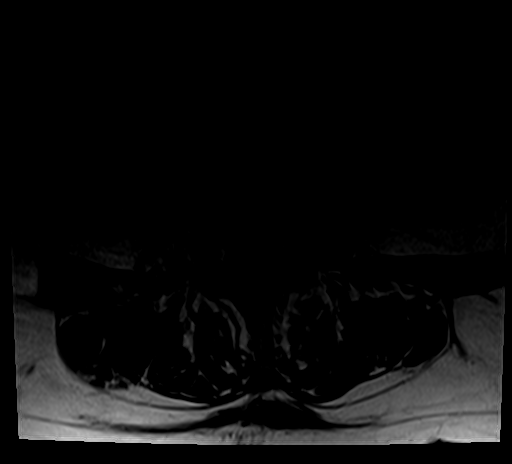
[im 21/32]
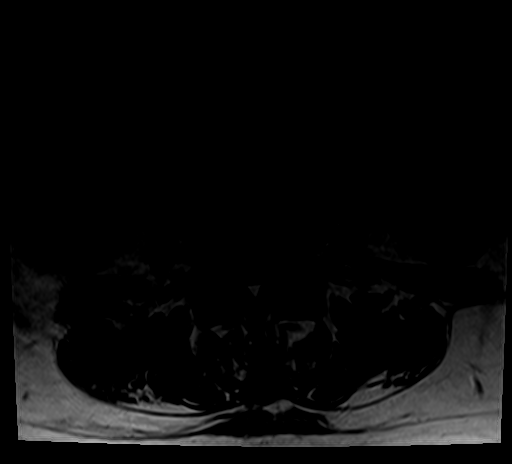
[im 28/32]
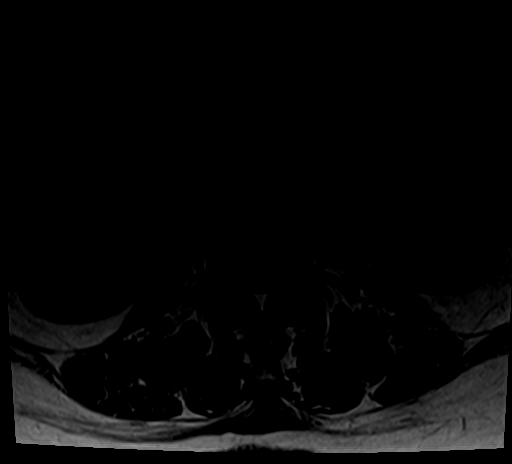
[im 32/32]
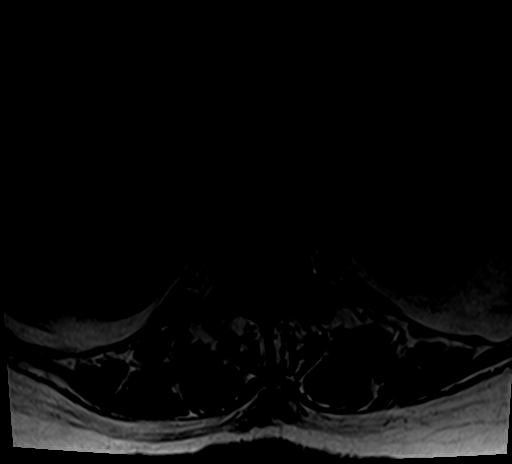

[Series 7: T2 post-contrast · sagittal · 4.0mm · 0.55mm/px · 4 of 13 slices shown]
[im 1/13]
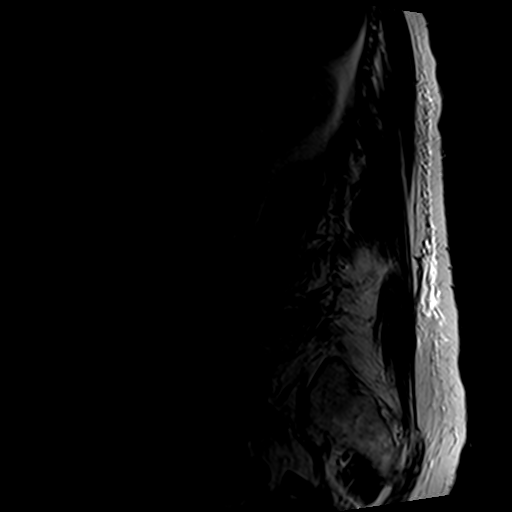
[im 5/13]
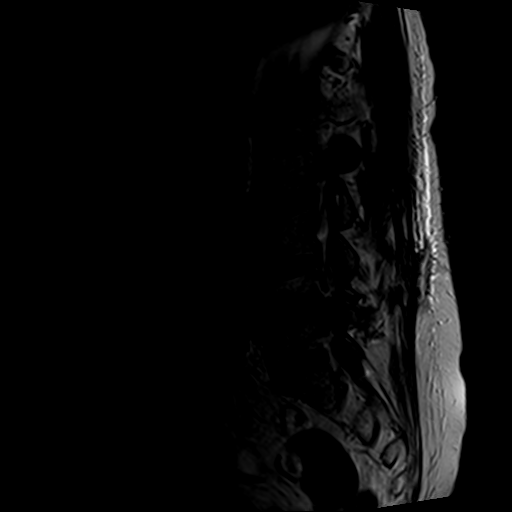
[im 9/13]
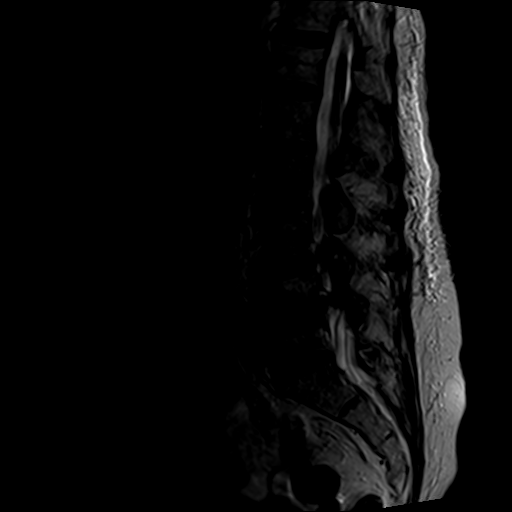
[im 13/13]
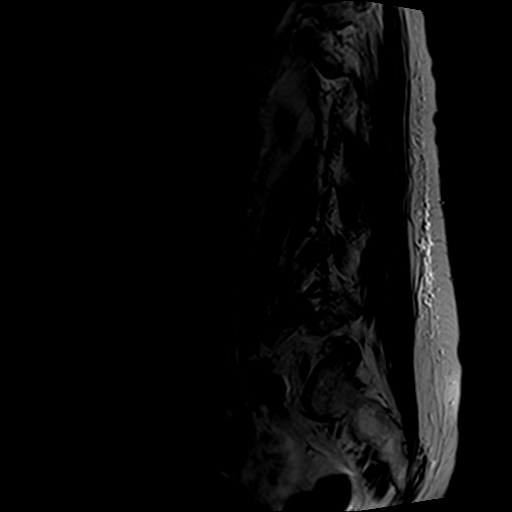

[25 of 48 positions shown; findings below may reference images not displayed]

FINDINGS: Segmentation:  Normal

Alignment:  Normal

Vertebrae: Endplate destruction at L4-5 unchanged. Diffuse bone
marrow edema and enhancement L4-5 compatible with discitis and
osteomyelitis. Loss of height of the L4 vertebral body is unchanged.
Paraspinous soft tissue edema and enhancement is unchanged.
Enhancing tissue dorsal to the disc space at L4-5 also unchanged
likely related to phlegmon. No new areas of infection or fracture.

Conus medullaris: Extends to the L1-2 level and appears normal.

Paraspinal and other soft tissues: Paraspinous soft tissue
thickening and enhancement at L4-5 unchanged. No psoas abscess.
Nonenhancing complex fluid collection posterior to the lamina on the
right is unchanged. This has been aspirated and was negative for
bacterial growth. Small fluid collection extending to the
subcutaneous tissues has improved in the interval. No evidence of
epidural abscess.

Disc levels:

L1-2:  Negative

L2-3:  Negative

L3-4:  Mild facet degeneration without stenosis

L4-5 as above

L5-S1:  Negative
IMPRESSION: L4-5 discitis and osteomyelitis similar to the prior study. No
further collapse of the L4 vertebral body. No epidural abscess

15 mm nonenhancing complex fluid collection posterior to the lamina
on the right is stable and may represent chronic hematoma or treated
infection. Paraspinous soft tissue thickening enhancement around the
L4-5 disc space is stable.

## 2018-10-30 DIAGNOSIS — C8218 Follicular lymphoma grade II, lymph nodes of multiple sites: Secondary | ICD-10-CM | POA: Diagnosis not present

## 2020-08-02 ENCOUNTER — Inpatient Hospital Stay: Payer: Medicare Other | Attending: Hematology and Oncology

## 2020-08-02 ENCOUNTER — Encounter: Payer: Self-pay | Admitting: Hematology and Oncology

## 2020-08-02 ENCOUNTER — Inpatient Hospital Stay (INDEPENDENT_AMBULATORY_CARE_PROVIDER_SITE_OTHER): Payer: Medicare Other | Admitting: Hematology and Oncology

## 2020-08-02 ENCOUNTER — Telehealth: Payer: Self-pay | Admitting: Oncology

## 2020-08-02 DIAGNOSIS — Z8579 Personal history of other malignant neoplasms of lymphoid, hematopoietic and related tissues: Secondary | ICD-10-CM | POA: Insufficient documentation

## 2020-08-02 DIAGNOSIS — C8218 Follicular lymphoma grade II, lymph nodes of multiple sites: Secondary | ICD-10-CM

## 2020-08-02 LAB — CBC AND DIFFERENTIAL
HCT: 40 (ref 36–46)
Hemoglobin: 13 (ref 12.0–16.0)
Neutrophils Absolute: 3.03
Platelets: 200 (ref 150–399)
WBC: 5.5

## 2020-08-02 LAB — BASIC METABOLIC PANEL
BUN: 45 — AB (ref 4–21)
CO2: 24 — AB (ref 13–22)
Chloride: 102 (ref 99–108)
Creatinine: 2.1 — AB (ref 0.5–1.1)
Glucose: 97
Potassium: 4.7 (ref 3.4–5.3)
Sodium: 136 — AB (ref 137–147)

## 2020-08-02 LAB — COMPREHENSIVE METABOLIC PANEL
Albumin: 4.7 (ref 3.5–5.0)
Calcium: 9.8 (ref 8.7–10.7)

## 2020-08-02 LAB — HEPATIC FUNCTION PANEL
ALT: 16 (ref 7–35)
AST: 31 (ref 13–35)
Alkaline Phosphatase: 80 (ref 25–125)
Bilirubin, Total: 0.9

## 2020-08-02 LAB — LACTATE DEHYDROGENASE: LDH: 169 U/L (ref 98–192)

## 2020-08-02 LAB — CBC: RBC: 4.12 (ref 3.87–5.11)

## 2020-08-02 NOTE — Progress Notes (Signed)
[facility]  [address] [place],  [place]  [address] [phone]  Clinic Day:  08/02/2020  Referring physician: Maris Berger, MD   CHIEF COMPLAINT:  CC: Stage IIIB follicular lymphoma  Current Treatment: Observation    HISTORY OF PRESENT ILLNESS:  Sharon Griffin is a 75 y.o. 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 She presented with significant upper abdominal adenopathy, with associated weight loss and malnutrition.  She was initially treated on a clinical trial and received bendamustine, ofatumumab, and bortezomib.  She received 1 cycle with an excellent response, but had severe toxicities and required hospitalization for IV fluids and antiemetics.  Chemotherapy was discontinued, but she did well for over a year with no further therapy.  In August 2013, she had progressive disease and was treated with bendamustine and rituximab for 6 cycles completed in April 2014 with a good response.  She has not required treatment since then.  Colonoscopy and endoscopy in May 2016 revealed diverticulosis and a hiatal hernia.    She was lost to follow-up between March 2017 and October 2018.  She had multiple health issues during that interval, including back surgery followed by osteomyelitis and diskitis.  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  She apparently got to the point where she could not ambulate or think straight, and had a prolonged hospitalization in [facility] followed by a stay in rehabilitation. CT chest, abdomen and pelvis in April 2019 revealed stable retroperitoneal soft tissue mass.  CT was done without contrast due to chronic kidney disease.  Repeat CT imaging in June 2020 was again stable.  Mammogram in November 2020 did not reveal any evidence of malignancy.  CT head in December 2020 was stable with VP shunt in place.   CT chest, abdomen  and pelvis in September 2021 revealed unchanged post treatment appearance of celiac axis/gastrohepatic ligament lymph nodes.  No other evidence of mass or lymphadenopathy in the chest, abdomen or pelvis. She had a Cologuard test which came back abnormal and was scheduled for colonoscopy Dr. Noberto Retort.   INTERVAL HISTORY:  Sharon Griffin is here today for repeat clinical assessment. She denies fevers, chills or night sweats. She denies fatigue or early satiety. She denies pain. Her appetite is good and she has been drinking plenty of fluids. Her weight has been stable.  She continues to follow up yearly with Dr. Katherine Roan, neurologist, yesterday with a good report. She states she is now followed by Dr. Olivia Mackie, nephrologist, in [place] for chronic kidney disease. Review of her records shows  She underwent colonoscopy in October and was found to have a tubular adenomas of the rectum.  She then underwent transanal excision of the tubular adenomas of the rectum later in October. Surgical pathology did not reveal any high-grade dysplasia or malignancy. Bilateral  Screening mammogram in January did not reveal any evidence of malignancy.  REVIEW OF SYSTEMS:  Review of Systems  Constitutional: Negative for appetite change, chills, fatigue, fever and unexpected weight change.  HENT:   Negative for lump/mass, mouth sores and sore throat.   Respiratory: Negative for cough and shortness of breath.   Cardiovascular: Negative for chest pain and leg swelling.  Gastrointestinal: Negative for abdominal pain, constipation, diarrhea, nausea and vomiting.  Endocrine: Negative for hot flashes.  Genitourinary: Negative for difficulty urinating, dysuria, frequency and hematuria.   Musculoskeletal: Negative for arthralgias, back pain and  myalgias.  Skin: Negative for rash.  Neurological: Negative for dizziness and headaches.  Hematological: Negative for adenopathy. Does not bruise/bleed easily.  Psychiatric/Behavioral:  Negative for depression and sleep disturbance. The patient is not nervous/anxious.      VITALS:  Blood pressure (!) 155/67, pulse 61, temperature 97.8 F (36.6 C), temperature source Oral, resp. rate 18, height 5\' 2"  (1.575 m), weight 132 lb 14.4 oz (60.3 kg), SpO2 98 %.  Wt Readings from Last 3 Encounters:  08/02/20 132 lb 14.4 oz (60.3 kg)  05/29/16 135 lb (61.2 kg)  05/01/16 140 lb (63.5 kg)    Body mass index is 24.31 kg/m.  Performance status (ECOG): 0 - Asymptomatic  PHYSICAL EXAM:  Physical Exam Vitals and nursing note reviewed.  Constitutional:      General: She is not in acute distress.    Appearance: Normal appearance.  HENT:     Head: Normocephalic and atraumatic.     Mouth/Throat:     Mouth: Mucous membranes are moist.     Pharynx: Oropharynx is clear. No oropharyngeal exudate or posterior oropharyngeal erythema.  Eyes:     General: No scleral icterus.    Extraocular Movements: Extraocular movements intact.     Conjunctiva/sclera: Conjunctivae normal.     Pupils: Pupils are equal, round, and reactive to light.  Cardiovascular:     Rate and Rhythm: Normal rate and regular rhythm.     Heart sounds: Normal heart sounds. No murmur heard. No friction rub. No gallop.   Pulmonary:     Effort: Pulmonary effort is normal.     Breath sounds: Normal breath sounds. No rhonchi or rales.  Chest:  Breasts:     Right: No axillary adenopathy or supraclavicular adenopathy.     Left: No axillary adenopathy or supraclavicular adenopathy.    Abdominal:     General: There is no distension.     Palpations: Abdomen is soft. There is no hepatomegaly, splenomegaly or mass.     Tenderness: There is no abdominal tenderness.  Musculoskeletal:        General: Normal range of motion.     Cervical back: Normal range of motion and neck supple. No tenderness.     Right lower leg: No edema.     Left lower leg: No edema.  Lymphadenopathy:     Cervical: No cervical adenopathy.      Upper Body:     Right upper body: No supraclavicular or axillary adenopathy.     Left upper body: No supraclavicular or axillary adenopathy.     Lower Body: No right inguinal adenopathy. No left inguinal adenopathy.  Skin:    General: Skin is warm and dry.     Coloration: Skin is not jaundiced.     Findings: No rash.  Neurological:     Mental Status: She is alert and oriented to person, place, and time.     Cranial Nerves: No cranial nerve deficit.  Psychiatric:        Mood and Affect: Mood normal.        Behavior: Behavior normal.        Thought Content: Thought content normal.    LABS:   CBC Latest Ref Rng & Units 08/02/2020 05/29/2016 05/01/2016  WBC - 5.5 5.0 5.0  Hemoglobin 12.0 - 16.0 13.0 10.7(L) 10.0(L)  Hematocrit 36 - 46 40 32.2(L) 29.7(L)  Platelets 150 - 399 200 187 163   CMP Latest Ref Rng & Units 08/02/2020 05/29/2016 04/21/2016  Glucose 65 - 99  mg/dL - 91 149(H)  BUN 4 - 21 45(A) 26(H) 30(H)  Creatinine 0.5 - 1.1 2.1(A) 1.85(H) 1.62(H)  Sodium 137 - 147 136(A) 140 139  Potassium 3.4 - 5.3 4.7 3.7 3.2(L)  Chloride 99 - 108 102 96(L) 100(L)  CO2 13 - 22 24(A) 33(H) 30  Calcium 8.7 - 10.7 9.8 9.6 9.4  Total Protein 6.5 - 8.1 g/dL - - 6.3(L)  Total Bilirubin 0.3 - 1.2 mg/dL - - 0.6  Alkaline Phos 25 - 125 80 - 80  AST 13 - 35 31 - 28  ALT 7 - 35 16 - 16     No results found for: CEA1 / No results found for: CEA1 No results found for: PSA1 No results found for: WCB762 No results found for: CAN125  No results found for: TOTALPROTELP, ALBUMINELP, A1GS, A2GS, BETS, BETA2SER, GAMS, MSPIKE, SPEI No results found for: TIBC, FERRITIN, IRONPCTSAT No results found for: LDH  STUDIES:  No results found.    HISTORY:   Past Medical History:  Diagnosis Date  . Cancer [facility])     History reviewed. No pertinent surgical history.  History reviewed. No pertinent family history.  Social History:  reports that she has never smoked. She has never used smokeless tobacco.  She reports that she does not drink alcohol and does not use drugs.The patient is alone today.  Allergies:  Allergies  Allergen Reactions  . Codeine Other (See Comments)    Unknown    Current Medications: Current Outpatient Medications  Medication Sig Dispense Refill  . atorvastatin (LIPITOR) 10 MG tablet Take 10 mg by mouth every evening.    . calcium-vitamin D (OSCAL WITH D) 500-200 MG-UNIT TABS tablet Take 1 tablet by mouth 2 (two) times daily.    . cholecalciferol (VITAMIN D) 1000 units tablet Take 1,000 Units by mouth at bedtime.    . docusate sodium (COLACE) 100 MG capsule Take 100 mg by mouth every 12 (twelve) hours as needed for mild constipation.    Marland Kitchen escitalopram (LEXAPRO) 20 MG tablet Take 20 mg by mouth daily.    Marland Kitchen gabapentin (NEURONTIN) 100 MG capsule Take 100 mg by mouth 2 (two) times daily.    . hydrochlorothiazide (MICROZIDE) 12.5 MG capsule Take 12.5 mg by mouth daily.    Marland Kitchen levothyroxine (SYNTHROID, LEVOTHROID) 50 MCG tablet Take 50 mcg by mouth daily before breakfast.    . lidocaine-prilocaine (EMLA) cream Apply 1 application topically as needed (for port).    Marland Kitchen lisinopril (PRINIVIL,ZESTRIL) 10 MG tablet Take 10 mg by mouth daily.    . Multiple Vitamins-Calcium (ESSENTIAL ONE DAILY MULTIVIT PO) Take 1 tablet by mouth daily.    . ondansetron (ZOFRAN) 4 MG tablet Take 4 mg by mouth every 6 (six) hours as needed for nausea or vomiting.    Marland Kitchen oxyCODONE-acetaminophen (PERCOCET/ROXICET) 5-325 MG tablet Take 1 tablet by mouth every 6 (six) hours as needed for severe pain.    . pantoprazole (PROTONIX) 40 MG tablet Take 40 mg by mouth daily.    . traMADol (ULTRAM) 50 MG tablet Take 50 mg by mouth 2 (two) times daily.     No current facility-administered medications for this visit.     ASSESSMENT & PLAN:   Assessment:  Sharon Griffin is a 75 y.o. female with a history of low-grade follicular lymphoma.  She has not required treatment in years.  She remains without evidence of  recurrent/progressive disease.  Plan:    We will continue observation.  We will  plan to see her back in 6 months with a CBC, CMP, LDH and CT chest, abdomen and pelvis. The patient understands the plans discussed today and is in agreement with them.  She knows to contact our office if she develops concerns prior to her next appointment.     Marvia Pickles, PA-C

## 2020-08-02 NOTE — Telephone Encounter (Signed)
Per 3/22 LOS, patient scheduled for 9/21 Labs, CT Scan - 9/22 Follow Up.  Gave patient Appt Summary/Orders

## 2021-01-26 NOTE — Progress Notes (Signed)
[facility]  [address] [place],  [place]  [address] [phone]  Clinic Day:  02/02/2021  Referring physician: Maris Berger, MD  This document serves as a record of services personally performed by Hosie Poisson, MD. It was created on their behalf by [facility], a trained medical scribe. The creation of this record is based on the scribe's personal observations and the provider's statements to them.  CHIEF COMPLAINT:  CC: Stage IIIB follicular lymphoma  Current Treatment: Observation    HISTORY OF PRESENT ILLNESS:  Sharon Griffin is a 75 y.o. 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 She presented with significant upper abdominal adenopathy, with associated weight loss and malnutrition.  She was initially treated on a clinical trial and received bendamustine, ofatumumab, and bortezomib.  She received 1 cycle with an excellent response, but had severe toxicities and required hospitalization for IV fluids and antiemetics.  Chemotherapy was discontinued, but she did well for over a year with no further therapy.  In August 2013, she had progressive disease and was treated with bendamustine and rituximab for 6 cycles completed in April 2014 with a good response.  She has not required treatment since then.  Colonoscopy and endoscopy in May 2016 revealed diverticulosis and a hiatal hernia.    She was lost to follow-up between March 2017 and October 2018.  She had multiple health issues during that interval, including back surgery followed by osteomyelitis and diskitis.  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  She apparently got to the point where she could not ambulate or think straight, and had a prolonged hospitalization in [facility] followed by a stay in rehabilitation. CT chest, abdomen and pelvis in April 2019 revealed stable retroperitoneal  soft tissue mass.  CT was done without contrast due to chronic kidney disease.  Repeat CT imaging in June 2020 was again stable.  Mammogram in November 2020 did not reveal any evidence of malignancy.  CT head in December 2020 was stable with VP shunt in place.   CT chest, abdomen and pelvis in September 2021 revealed unchanged post treatment appearance of celiac axis/gastrohepatic ligament lymph nodes.  No other evidence of mass or lymphadenopathy in the chest, abdomen or pelvis. She had a Cologuard test which came back abnormal and was scheduled for colonoscopy Dr. Noberto Retort. Review of her records shows  She underwent colonoscopy in October and was found to have a tubular adenomas of the rectum.  She then underwent transanal excision of the tubular adenomas of the rectum later in October. Surgical pathology did not reveal any high-grade dysplasia or malignancy. She states she is now followed by Dr. Olivia Mackie, nephrologist, in [place] for chronic kidney disease. Bilateral  Screening mammogram in January did not reveal any evidence of malignancy.  INTERVAL HISTORY:  Sharon Griffin is here for routine follow up and states that she has been well. CT chest, abdomen and pelvis revealed stable to mildly improved exam with no new or progressive findings. Stable post treatment related changes about the celiac region. Stable appearance of gastric thickening perhaps related to post treatment changes. Hemoglobin was mildly low at 11.8, and white count and platelets are normal. Chemistries are unremarkable except for a BUN of 37, a creatinine of 2.2, previously 2.1, and a potassium of 5.3. I gave her a list of foods to exclude from her diet. Her  appetite is good, and  her weight is stable since her last visit.  She denies fever, chills or other signs of infection.  She denies nausea, vomiting, bowel issues, or abdominal pain.  She denies sore throat, cough, dyspnea, or chest pain.  REVIEW OF SYSTEMS:  Review of Systems   Constitutional: Negative.  Negative for appetite change, chills, fatigue, fever and unexpected weight change.  HENT:  Negative.    Eyes: Negative.   Respiratory: Negative.  Negative for chest tightness, cough, hemoptysis, shortness of breath and wheezing.   Cardiovascular: Negative.  Negative for chest pain, leg swelling and palpitations.  Gastrointestinal: Negative.  Negative for abdominal distention, abdominal pain, blood in stool, constipation, diarrhea, nausea and vomiting.  Endocrine: Negative.   Genitourinary: Negative.  Negative for difficulty urinating, dysuria, frequency and hematuria.   Musculoskeletal: Negative.  Negative for arthralgias, back pain, flank pain, gait problem and myalgias.  Skin: Negative.   Neurological: Negative.  Negative for dizziness, extremity weakness, gait problem, headaches, light-headedness, numbness, seizures and speech difficulty.  Hematological: Negative.   Psychiatric/Behavioral: Negative.  Negative for depression and sleep disturbance. The patient is not nervous/anxious.     VITALS:  Blood pressure (!) 150/65, pulse 63, temperature 98.6 F (37 C), temperature source Oral, resp. rate 18, height 5\' 2"  (1.575 m), weight 137 lb 3.2 oz (62.2 kg), SpO2 98 %.  Wt Readings from Last 3 Encounters:  02/02/21 137 lb 3.2 oz (62.2 kg)  08/02/20 132 lb 14.4 oz (60.3 kg)  05/29/16 135 lb (61.2 kg)    Body mass index is 25.09 kg/m.  Performance status (ECOG): 0 - Asymptomatic  PHYSICAL EXAM:  Physical Exam Constitutional:      General: She is not in acute distress.    Appearance: Normal appearance. She is normal weight.  HENT:     Head: Normocephalic and atraumatic.  Eyes:     General: No scleral icterus.    Extraocular Movements: Extraocular movements intact.     Conjunctiva/sclera: Conjunctivae normal.     Pupils: Pupils are equal, round, and reactive to light.  Cardiovascular:     Rate and Rhythm: Normal rate and regular rhythm.     Pulses:  Normal pulses.     Heart sounds: Normal heart sounds. No murmur heard.   No friction rub. No gallop.  Pulmonary:     Effort: Pulmonary effort is normal. No respiratory distress.     Breath sounds: Normal breath sounds.  Abdominal:     General: Bowel sounds are normal. There is no distension.     Palpations: Abdomen is soft. There is no hepatomegaly, splenomegaly or mass.     Tenderness: There is no abdominal tenderness.  Musculoskeletal:        General: Normal range of motion.     Cervical back: Normal range of motion and neck supple.     Right lower leg: No edema.     Left lower leg: No edema.  Lymphadenopathy:     Cervical: No cervical adenopathy.  Skin:    General: Skin is warm and dry.  Neurological:     General: No focal deficit present.     Mental Status: She is alert and oriented to person, place, and time. Mental status is at baseline.  Psychiatric:        Mood and Affect: Mood normal.        Behavior: Behavior normal.        Thought Content: Thought content normal.        Judgment: Judgment normal.  LABS:   CBC Latest Ref Rng & Units 02/01/2021 08/02/2020 05/29/2016  WBC - 5.0 5.5 5.0  Hemoglobin 12.0 - 16.0 11.8(A) 13.0 10.7(L)  Hematocrit 36 - 46 34(A) 40 32.2(L)  Platelets 150 - 399 182 200 187   CMP Latest Ref Rng & Units 02/01/2021 08/02/2020 05/29/2016  Glucose 65 - 99 mg/dL - - 91  BUN 4 - 21 37(A) 45(A) 26(H)  Creatinine 0.5 - 1.1 2.2(A) 2.1(A) 1.85(H)  Sodium 137 - 147 135(A) 136(A) 140  Potassium 3.4 - 5.3 5.3 4.7 3.7  Chloride 99 - 108 100 102 96(L)  CO2 13 - 22 26(A) 24(A) 33(H)  Calcium 8.7 - 10.7 9.7 9.8 9.6  Total Protein 6.5 - 8.1 g/dL - - -  Total Bilirubin 0.3 - 1.2 mg/dL - - -  Alkaline Phos 25 - 125 72 80 -  AST 13 - 35 32 31 -  ALT 7 - 35 22 16 -    Lab Results  Component Value Date   LDH 169 08/02/2020    STUDIES:  No results found.   EXAM: 02/01/2021 CT CHEST, ABDOMEN AND PELVIS WITHOUT CONTRAST TECHNIQUE: Multidetector CT  imaging of the chest, abdomen and pelvis was performed following the standard protocol without IV contrast. COMPARISON: September of 2021 FINDINGS: CT CHEST FINDINGS Cardiovascular: Noncontrast appearance of heart and great vessels is stable with signs of calcified coronary artery disease and mild generalized aortic atherosclerosis. Mediastinum/Nodes: Esophagus grossly normal. No signs of adenopathy in the chest. Lungs/Pleura: Lungs are clear. Airways are patent. Mild biapical scarring. Musculoskeletal: See below for full musculoskeletal details. Shunt tubing passes along the anterior chest wall extending into RIGHT lower quadrant. CT ABDOMEN PELVIS FINDINGS Hepatobiliary: No acute hepatobiliary process. Smooth hepatic contour. Limited assessment on noncontrast imaging. Pancreas: Pancreas contour is similar to prior imaging. Juxta pancreatic soft tissue and lobularity along the anterior margin of the aorta and LEFT diaphragmatic crus 2.9 x 1.6 cm, previously 3.3 x 1.6 cm. No signs of inflammation. Spleen normal size and contour. Spleen: Normal size and contour. Patient Name: LOUELLEN, HALDEMAN Account # [id number] Courtesy Copy to: Diagnostic Imaging Report Adrenals/Urinary Tract: LEFT adrenal inseparable from retroperitoneal soft tissue without change. The RIGHT adrenal is normal. Renal cortical scarring with similar appearance to prior imaging. No hydronephrosis. No perinephric stranding. No nephrolithiasis. No thickening of the urinary bladder. Stomach/Bowel: Mild gastric thickening suggested unchanged perhaps related to post treatment changes. No acute bowel process. Appendix is normal. Ingested contrast has passed into the colon. Vascular/Lymphatic: Atheromatous plaque in the abdominal aorta. Soft tissue adjacent to celiac and pancreas with smaller lymph nodes in the area with no change. No new signs of adenopathy in the abdomen or in the pelvis Reproductive: No  adnexal mass. Other: No ascites. No focal fluid about the shunt which continues from the RIGHT upper quadrant into the LEFT lower quadrant. Musculoskeletal: Osteopenia. No acute or destructive bone process. Spinal degenerative changes. Complete loss of the disc space at L4-5 with bony fusion at this level shows no change. IMPRESSION: Stable exam, no new or progressive findings. Stable post treatment related changes about the celiac region. Stable appearance of gastric thickening perhaps related to post treatment changes. Correlate with any signs of gastritis. Aortic Atherosclerosis (ICD10-I70.0). Coronary artery disease.  HISTORY:   Allergies:  Allergies  Allergen Reactions   Azithromycin Hives    During hospital admission at [facility] 12/2015- med DC due to occurrence of questionable V-Tach versus Torsades.   Codeine Other (See Comments)  Unknown    Current Medications: Current Outpatient Medications  Medication Sig Dispense Refill   escitalopram (LEXAPRO) 20 MG tablet Take 1 tablet by mouth daily.     levothyroxine (SYNTHROID) 50 MCG tablet Take by mouth.     lisinopril (ZESTRIL) 5 MG tablet Take by mouth.     Multiple Vitamin (MULTI-VITAMINS) TABS Take by mouth.     amLODipine (NORVASC) 10 MG tablet Take 10 mg by mouth daily.     atorvastatin (LIPITOR) 10 MG tablet Take 10 mg by mouth every evening.     calcium-vitamin D (OSCAL WITH D) 500-200 MG-UNIT TABS tablet Take 1 tablet by mouth 2 (two) times daily.     cholecalciferol (VITAMIN D) 1000 units tablet Take 1,000 Units by mouth at bedtime.     docusate sodium (COLACE) 100 MG capsule Take 100 mg by mouth every 12 (twelve) hours as needed for mild constipation.     famotidine (PEPCID) 40 MG tablet Take 40 mg by mouth daily.     lidocaine-prilocaine (EMLA) cream Apply 1 application topically as needed (for port).     ondansetron (ZOFRAN) 4 MG tablet Take 4 mg by mouth every 6 (six) hours as needed for nausea or vomiting.      pantoprazole (PROTONIX) 40 MG tablet Take 40 mg by mouth daily.     No current facility-administered medications for this visit.     ASSESSMENT & PLAN:   Assessment:   History of low-grade follicular lymphoma, diagnosed in March 2012.  She has not required treatment in years.  She remains without evidence of recurrent/progressive disease.  Chronic kidney disease. She continues to follow with a nephrologist.  Hyperkalemia. I gave her a list of foods to exclude from her diet.  Plan:     CT imaging remains stable, so we will continue observation and repeat imaging again in 1 year.  We will plan to see her back in 6 months with a CBC, CMP and LDH for repeat evaluation. Her labs will be ordered through Dr. Selena Batten. The patient understands the plans discussed today and is in agreement with them.  She knows to contact our office if she develops concerns prior to her next appointment.   I, Rita Ohara, am acting as scribe for Derwood Kaplan, MD  I have reviewed this report as typed by the medical scribe, and it is complete and accurate.

## 2021-01-31 ENCOUNTER — Telehealth: Payer: Self-pay | Admitting: Oncology

## 2021-01-31 NOTE — Telephone Encounter (Signed)
Patient called to verify 9/22 Appt

## 2021-02-01 ENCOUNTER — Encounter: Payer: Self-pay | Admitting: Hematology and Oncology

## 2021-02-01 LAB — BASIC METABOLIC PANEL
BUN: 37 — AB (ref 4–21)
CO2: 26 — AB (ref 13–22)
Chloride: 100 (ref 99–108)
Creatinine: 2.2 — AB (ref 0.5–1.1)
Glucose: 92
Potassium: 5.3 (ref 3.4–5.3)
Sodium: 135 — AB (ref 137–147)

## 2021-02-01 LAB — CBC AND DIFFERENTIAL
HCT: 34 — AB (ref 36–46)
Hemoglobin: 11.8 — AB (ref 12.0–16.0)
Neutrophils Absolute: 2.7
Platelets: 182 (ref 150–399)
WBC: 5

## 2021-02-01 LAB — COMPREHENSIVE METABOLIC PANEL
Albumin: 4.6 (ref 3.5–5.0)
Calcium: 9.7 (ref 8.7–10.7)

## 2021-02-01 LAB — CBC: RBC: 3.64 — AB (ref 3.87–5.11)

## 2021-02-01 LAB — HEPATIC FUNCTION PANEL
ALT: 22 (ref 7–35)
AST: 32 (ref 13–35)
Alkaline Phosphatase: 72 (ref 25–125)
Bilirubin, Total: 0.8

## 2021-02-02 ENCOUNTER — Encounter: Payer: Self-pay | Admitting: Oncology

## 2021-02-02 ENCOUNTER — Inpatient Hospital Stay: Payer: Medicare Other | Attending: Oncology | Admitting: Oncology

## 2021-02-02 ENCOUNTER — Telehealth: Payer: Self-pay | Admitting: Oncology

## 2021-02-02 ENCOUNTER — Other Ambulatory Visit: Payer: Medicare Other

## 2021-02-02 VITALS — BP 150/65 | HR 63 | Temp 98.6°F | Resp 18 | Ht 62.0 in | Wt 137.2 lb

## 2021-02-02 DIAGNOSIS — C8218 Follicular lymphoma grade II, lymph nodes of multiple sites: Secondary | ICD-10-CM

## 2021-02-02 LAB — LACTATE DEHYDROGENASE: LDH: 497

## 2021-02-02 NOTE — Telephone Encounter (Signed)
Per 9/22 LOS, patient scheduled for March 2023 Appt's.  Gave patient Appt Summary

## 2021-08-01 NOTE — Progress Notes (Deleted)
?Sharon Griffin  ?[address] ?[place],  [place]  [address] ?[phone] ? ?Clinic Day:  08/01/2021 ? ?Referring physician: Maris Berger, MD ? ?This document serves as a record of services personally performed by Sharon Poisson, MD. It was created on their behalf by Curry,Lauren E, a trained medical scribe. The creation of this record is based on the scribe's personal observations and the provider's statements to them. ? ?CHIEF COMPLAINT:  ?CC: Stage IIIB follicular lymphoma ? ?Current Treatment: Observation  ? ? ?HISTORY OF PRESENT ILLNESS:  ?Sharon Griffin is a 76 y.o. 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 She presented with significant upper abdominal adenopathy, with associated weight loss and malnutrition.  She was initially treated on a clinical trial and received bendamustine, ofatumumab, and bortezomib.  She received 1 cycle with an excellent response, but had severe toxicities and required hospitalization for IV fluids and antiemetics.  Chemotherapy was discontinued, but she did well for over a year with no further therapy.  In August 2013, she had progressive disease and was treated with bendamustine and rituximab for 6 cycles completed in April 2014 with a good response.  She has not required treatment since then.  Colonoscopy and endoscopy in May 2016 revealed diverticulosis and a hiatal hernia.   ? ?She was lost to follow-up between March 2017 and October 2018.  She had multiple health issues during that interval, including back surgery followed by osteomyelitis and diskitis.  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  She apparently got to the point where she could not ambulate or think straight, and had a prolonged hospitalization in [facility] followed by a stay in rehabilitation. CT chest, abdomen and pelvis in April 2019 revealed stable retroperitoneal  soft tissue mass.  CT was done without contrast due to chronic kidney disease.  Repeat CT imaging in June 2020 was again stable.  Mammogram in November 2020 did not reveal any evidence of malignancy.  CT head in December 2020 was stable with VP shunt in place.  ? ?CT chest, abdomen and pelvis in September 2021 revealed unchanged post treatment appearance of celiac axis/gastrohepatic ligament lymph nodes.  No other evidence of mass or lymphadenopathy in the chest, abdomen or pelvis. She had a Cologuard test which came back abnormal and was scheduled for colonoscopy Dr. Noberto Retort. Review of her records shows  She underwent colonoscopy in October and was found to have a tubular adenomas of the rectum.  She then underwent transanal excision of the tubular adenomas of the rectum later in October. Surgical pathology did not reveal any high-grade dysplasia or malignancy. She states she is now followed by Dr. Olivia Mackie, nephrologist, in [place] for chronic kidney disease. Bilateral  Screening mammogram in January did not reveal any evidence of malignancy. ? ?INTERVAL HISTORY:  ?Sharon Griffin is here for routine follow up and states that she has been well. CT chest, abdomen and pelvis revealed stable to mildly improved exam with no new or progressive findings. Stable post treatment related changes about the celiac region. Stable appearance of gastric thickening perhaps related to post treatment changes. Hemoglobin was mildly low at 11.8, and white count and platelets are normal. Chemistries are unremarkable except for a BUN of 37, a creatinine of 2.2, previously 2.1, and a potassium of 5.3. I gave her a list of foods to exclude from her diet. Her  appetite is good, and  her weight is stable since her last visit.  She denies fever, chills or other signs of infection.  She denies nausea, vomiting, bowel issues, or abdominal pain.  She denies sore throat, cough, dyspnea, or chest pain. ? ?REVIEW OF SYSTEMS:  ?Review of Systems   ?Constitutional: Negative.  Negative for appetite change, chills, fatigue, fever and unexpected weight change.  ?HENT:  Negative.    ?Eyes: Negative.   ?Respiratory: Negative.  Negative for chest tightness, cough, hemoptysis, shortness of breath and wheezing.   ?Cardiovascular: Negative.  Negative for chest pain, leg swelling and palpitations.  ?Gastrointestinal: Negative.  Negative for abdominal distention, abdominal pain, blood in stool, constipation, diarrhea, nausea and vomiting.  ?Endocrine: Negative.   ?Genitourinary: Negative.  Negative for difficulty urinating, dysuria, frequency and hematuria.   ?Musculoskeletal: Negative.  Negative for arthralgias, back pain, flank pain, gait problem and myalgias.  ?Skin: Negative.   ?Neurological: Negative.  Negative for dizziness, extremity weakness, gait problem, headaches, light-headedness, numbness, seizures and speech difficulty.  ?Hematological: Negative.   ?Psychiatric/Behavioral: Negative.  Negative for depression and sleep disturbance. The patient is not nervous/anxious.    ? ?VITALS:  ?There were no vitals taken for this visit.  ?Wt Readings from Last 3 Encounters:  ?02/02/21 137 lb 3.2 oz (62.2 kg)  ?08/02/20 132 lb 14.4 oz (60.3 kg)  ?05/29/16 135 lb (61.2 kg)  ?  ?There is no height or weight on file to calculate BMI. ? ?Performance status (ECOG): 0 - Asymptomatic ? ?PHYSICAL EXAM:  ?Physical Exam ?Constitutional:   ?   General: She is not in acute distress. ?   Appearance: Normal appearance. She is normal weight.  ?HENT:  ?   Head: Normocephalic and atraumatic.  ?Eyes:  ?   General: No scleral icterus. ?   Extraocular Movements: Extraocular movements intact.  ?   Conjunctiva/sclera: Conjunctivae normal.  ?   Pupils: Pupils are equal, round, and reactive to light.  ?Cardiovascular:  ?   Rate and Rhythm: Normal rate and regular rhythm.  ?   Pulses: Normal pulses.  ?   Heart sounds: Normal heart sounds. No murmur heard. ?  No friction rub. No gallop.   ?Pulmonary:  ?   Effort: Pulmonary effort is normal. No respiratory distress.  ?   Breath sounds: Normal breath sounds.  ?Abdominal:  ?   General: Bowel sounds are normal. There is no distension.  ?   Palpations: Abdomen is soft. There is no hepatomegaly, splenomegaly or mass.  ?   Tenderness: There is no abdominal tenderness.  ?Musculoskeletal:     ?   General: Normal range of motion.  ?   Cervical back: Normal range of motion and neck supple.  ?   Right lower leg: No edema.  ?   Left lower leg: No edema.  ?Lymphadenopathy:  ?   Cervical: No cervical adenopathy.  ?Skin: ?   General: Skin is warm and dry.  ?Neurological:  ?   General: No focal deficit present.  ?   Mental Status: She is alert and oriented to person, place, and time. Mental status is at baseline.  ?Psychiatric:     ?   Mood and Affect: Mood normal.     ?   Behavior: Behavior normal.     ?   Thought Content: Thought content normal.     ?   Judgment: Judgment normal.  ? ?LABS:  ? ?CBC Latest Ref Rng & Units 02/01/2021 08/02/2020 05/29/2016  ?WBC - 5.0 5.5 5.0  ?  Hemoglobin 12.0 - 16.0 11.8(A) 13.0 10.7(L)  ?Hematocrit 36 - 46 34(A) 40 32.2(L)  ?Platelets 150 - 399 182 200 187  ? ?CMP Latest Ref Rng & Units 02/01/2021 08/02/2020 05/29/2016  ?Glucose 65 - 99 mg/dL - - 91  ?BUN 4 - 21 37(A) 45(A) 26(H)  ?Creatinine 0.5 - 1.1 2.2(A) 2.1(A) 1.85(H)  ?Sodium 137 - 147 135(A) 136(A) 140  ?Potassium 3.4 - 5.3 5.3 4.7 3.7  ?Chloride 99 - 108 100 102 96(L)  ?CO2 13 - 22 26(A) 24(A) 33(H)  ?Calcium 8.7 - 10.7 9.7 9.8 9.6  ?Total Protein 6.5 - 8.1 g/dL - - -  ?Total Bilirubin 0.3 - 1.2 mg/dL - - -  ?Alkaline Phos 25 - 125 72 80 -  ?AST 13 - 35 32 31 -  ?ALT 7 - 35 22 16 -  ? ? ?Lab Results  ?Component Value Date  ? LDH 169 08/02/2020  ? ? ?STUDIES:  ?No results found.  ? ?EXAM: 02/01/2021 ?CT CHEST, ABDOMEN AND PELVIS WITHOUT CONTRAST ?TECHNIQUE: ?Multidetector CT imaging of the chest, abdomen and pelvis was ?performed following the standard protocol without IV  contrast. ?COMPARISON: September of 2021 ?FINDINGS: ?CT CHEST FINDINGS ?Cardiovascular: Noncontrast appearance of heart and great vessels is ?stable with signs of calcified coronary artery disease and mild ?generalized aort

## 2021-08-02 ENCOUNTER — Ambulatory Visit: Payer: Medicare Other | Admitting: Oncology

## 2021-08-02 ENCOUNTER — Other Ambulatory Visit: Payer: Medicare Other

## 2021-08-02 DIAGNOSIS — C8218 Follicular lymphoma grade II, lymph nodes of multiple sites: Secondary | ICD-10-CM

## 2021-08-20 ENCOUNTER — Other Ambulatory Visit: Payer: Self-pay | Admitting: Oncology

## 2021-08-20 DIAGNOSIS — C8218 Follicular lymphoma grade II, lymph nodes of multiple sites: Secondary | ICD-10-CM

## 2021-08-20 NOTE — Progress Notes (Signed)
?[place]  ?[address] ?[place],  [place]  [address] ?[phone] ? ?Clinic Day:  08/21/21 ? ?Referring physician: Maris Berger, MD ? ?CHIEF COMPLAINT:  ?CC: Stage IIIB follicular lymphoma ? ?Current Treatment: Observation  ? ? ?HISTORY OF PRESENT ILLNESS:  ?Sharon Griffin is a 76 y.o. 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 She presented with significant upper abdominal adenopathy, with associated weight loss and malnutrition.  She was initially treated on a clinical trial and received bendamustine, ofatumumab, and bortezomib.  She received 1 cycle with an excellent response, but had severe toxicities and required hospitalization for IV fluids and antiemetics.  Chemotherapy was discontinued, but she did well for over a year with no further therapy.  In August 2013, she had progressive disease and was treated with bendamustine and rituximab for 6 cycles completed in April 2014 with a good response.  She has not required treatment since then.  Colonoscopy and endoscopy in May 2016 revealed diverticulosis and a hiatal hernia.   ? ?She was lost to follow-up between March 2017 and October 2018.  She had multiple health issues during that interval, including back surgery followed by osteomyelitis and diskitis.  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  She apparently got to the point where she could not ambulate or think straight, and had a prolonged hospitalization in [facility] followed by a stay in rehabilitation. CT chest, abdomen and pelvis in April 2019 revealed stable retroperitoneal soft tissue mass.  CT was done without contrast due to chronic kidney disease.  Repeat CT imaging in June 2020 was again stable.  Mammogram in November 2020 did not reveal any evidence of malignancy.  CT head in December 2020 was stable with VP shunt in place.  ? ?CT chest, abdomen and  pelvis in September 2021 revealed unchanged post treatment appearance of celiac axis/gastrohepatic ligament lymph nodes.  No other evidence of mass or lymphadenopathy in the chest, abdomen or pelvis. She had a Cologuard test which came back abnormal and was scheduled for colonoscopy with Dr. Noberto Retort. Review of her records shows she underwent colonoscopy in October and was found to have tubular adenomas of the rectum, and underwent transanal excision of this. Surgical pathology did not reveal any high-grade dysplasia or malignancy. She states she is now followed by Dr. Olivia Mackie, nephrologist, in [place] for chronic kidney disease. Bilateral  Screening mammogram in January of 2022 did not reveal any evidence of malignancy. CT chest, abdomen and pelvis done in September 2022 revealed stable to mildly improved exam with no new or progressive findings. She has stable post treatment related changes about the celiac region, and stable appearance of gastric thickening, perhaps related to post treatment changes. ? ?INTERVAL HISTORY:  ?Sharon Griffin is here for routine follow up and states that she has been well. Hemoglobin was mildly low at 11.8, and white count and platelets are normal. Chemistries are unremarkable except for a BUN of 45, a creatinine of 2.1, stable and her potassium is improved at 4.5. She had a screening annual bilateral mammogram in March which was clear. Her  appetite is good, but her weight is down 3 pounds since her last visit.  She denies fever, chills or other signs of infection.  She denies nausea, vomiting, bowel issues, or abdominal pain.  She denies sore throat, cough, dyspnea, or chest pain. ? ?REVIEW OF SYSTEMS:  ?Review of  Systems  ?Constitutional: Negative.  Negative for appetite change, chills, fatigue, fever and unexpected weight change.  ?HENT:  Negative.    ?Eyes: Negative.   ?Respiratory: Negative.  Negative for chest tightness, cough, hemoptysis, shortness of breath and wheezing.    ?Cardiovascular: Negative.  Negative for chest pain, leg swelling and palpitations.  ?Gastrointestinal: Negative.  Negative for abdominal distention, abdominal pain, blood in stool, constipation, diarrhea, nausea and vomiting.  ?Endocrine: Negative.   ?Genitourinary: Negative.  Negative for difficulty urinating, dysuria, frequency and hematuria.   ?Musculoskeletal: Negative.  Negative for arthralgias, back pain, flank pain, gait problem and myalgias.  ?Skin: Negative.   ?Neurological: Negative.  Negative for dizziness, extremity weakness, gait problem, headaches, light-headedness, numbness, seizures and speech difficulty.  ?Hematological: Negative.   ?Psychiatric/Behavioral: Negative.  Negative for depression and sleep disturbance. The patient is not nervous/anxious.    ? ?VITALS:  ?Blood pressure (!) 142/63, pulse 63, temperature 97.6 ?F (36.4 ?C), temperature source Oral, resp. rate 18, height '5\' 2"'$  (1.575 m), weight 134 lb 9.6 oz (61.1 kg), SpO2 98 %.  ?Wt Readings from Last 3 Encounters:  ?08/21/21 134 lb 9.6 oz (61.1 kg)  ?02/02/21 137 lb 3.2 oz (62.2 kg)  ?08/02/20 132 lb 14.4 oz (60.3 kg)  ?  ?Body mass index is 24.62 kg/m?. ? ?Performance status (ECOG): 0 - Asymptomatic ? ?PHYSICAL EXAM:  ?Physical Exam ?Constitutional:   ?   General: She is not in acute distress. ?   Appearance: Normal appearance. She is normal weight.  ?HENT:  ?   Head: Normocephalic and atraumatic.  ?Eyes:  ?   General: No scleral icterus. ?   Extraocular Movements: Extraocular movements intact.  ?   Conjunctiva/sclera: Conjunctivae normal.  ?   Pupils: Pupils are equal, round, and reactive to light.  ?Cardiovascular:  ?   Rate and Rhythm: Normal rate and regular rhythm.  ?   Pulses: Normal pulses.  ?   Heart sounds: Normal heart sounds. No murmur heard. ?  No friction rub. No gallop.  ?Pulmonary:  ?   Effort: Pulmonary effort is normal. No respiratory distress.  ?   Breath sounds: Normal breath sounds.  ?Abdominal:  ?   General: Bowel  sounds are normal. There is no distension.  ?   Palpations: Abdomen is soft. There is no hepatomegaly, splenomegaly or mass.  ?   Tenderness: There is no abdominal tenderness.  ?Musculoskeletal:     ?   General: Normal range of motion.  ?   Cervical back: Normal range of motion and neck supple.  ?   Right lower leg: No edema.  ?   Left lower leg: No edema.  ?Lymphadenopathy:  ?   Cervical: No cervical adenopathy.  ?Skin: ?   General: Skin is warm and dry.  ?Neurological:  ?   General: No focal deficit present.  ?   Mental Status: She is alert and oriented to person, place, and time. Mental status is at baseline.  ?Psychiatric:     ?   Mood and Affect: Mood normal.     ?   Behavior: Behavior normal.     ?   Thought Content: Thought content normal.     ?   Judgment: Judgment normal.  ? ?LABS:  ? ? ?  Latest Ref Rng & Units 08/21/2021  ? 12:00 AM 02/01/2021  ? 12:00 AM 08/02/2020  ? 12:00 AM  ?CBC  ?WBC  6.8      5.0   5.5       ?  Hemoglobin 12.0 - 16.0 11.8      11.8   13.0       ?Hematocrit 36 - 46 36      34   40       ?Platelets 150 - 400 K/uL 195      182   200       ?  ? This result is from an external source.  ? ? ?  Latest Ref Rng & Units 08/21/2021  ? 12:00 AM 02/01/2021  ? 12:00 AM 08/02/2020  ? 12:00 AM  ?CMP  ?BUN 4 - 21 45      37   45       ?Creatinine 0.5 - 1.1 2.1      2.2   2.1       ?Sodium 137 - 147 138      135   136       ?Potassium 3.5 - 5.1 mEq/L 4.5      5.3   4.7       ?Chloride 99 - 108 101      100   102       ?CO2 13 - '22 24      26   24       '$ ?Calcium 8.7 - 10.7 9.6      9.7   9.8       ?Alkaline Phos 25 - 125 72      72   80       ?AST 13 - 35 29      32   31       ?ALT 7 - 35 U/L '21      22   16       '$ ?  ? This result is from an external source.  ? ? ?Lab Results  ?Component Value Date  ? LDH 160 08/21/2021  ? LDH 169 08/02/2020  ? ? ?STUDIES:  ? ? EXAM: 07/12/21 ?DIGITAL SCREENING BILATERAL MAMMOGRAM WITH TOMOSYNTHESIS AND CAD  ?TECHNIQUE:  ?Bilateral screening digital craniocaudal and  mediolateral oblique  ?mammograms were obtained. Bilateral screening digital breast  ?tomosynthesis was performed. The images were evaluated with  ?computer-aided detection.  ?COMPARISON: Previous exam(s).  ?ACR [facility]

## 2021-08-21 ENCOUNTER — Inpatient Hospital Stay: Payer: PPO

## 2021-08-21 ENCOUNTER — Telehealth: Payer: Self-pay | Admitting: Oncology

## 2021-08-21 ENCOUNTER — Inpatient Hospital Stay: Payer: PPO | Attending: Oncology | Admitting: Oncology

## 2021-08-21 ENCOUNTER — Encounter: Payer: Self-pay | Admitting: Oncology

## 2021-08-21 ENCOUNTER — Other Ambulatory Visit: Payer: Medicare Other

## 2021-08-21 ENCOUNTER — Other Ambulatory Visit: Payer: Self-pay | Admitting: Oncology

## 2021-08-21 VITALS — BP 142/63 | HR 63 | Temp 97.6°F | Resp 18 | Ht 62.0 in | Wt 134.6 lb

## 2021-08-21 DIAGNOSIS — N189 Chronic kidney disease, unspecified: Secondary | ICD-10-CM | POA: Diagnosis not present

## 2021-08-21 DIAGNOSIS — N184 Chronic kidney disease, stage 4 (severe): Secondary | ICD-10-CM | POA: Diagnosis not present

## 2021-08-21 DIAGNOSIS — D631 Anemia in chronic kidney disease: Secondary | ICD-10-CM | POA: Diagnosis not present

## 2021-08-21 DIAGNOSIS — Z8572 Personal history of non-Hodgkin lymphomas: Secondary | ICD-10-CM | POA: Insufficient documentation

## 2021-08-21 DIAGNOSIS — C8218 Follicular lymphoma grade II, lymph nodes of multiple sites: Secondary | ICD-10-CM

## 2021-08-21 LAB — CBC AND DIFFERENTIAL
HCT: 36 (ref 36–46)
Hemoglobin: 11.8 — AB (ref 12.0–16.0)
Neutrophils Absolute: 3.81
Platelets: 195 10*3/uL (ref 150–400)
WBC: 6.8

## 2021-08-21 LAB — HEPATIC FUNCTION PANEL
ALT: 21 U/L (ref 7–35)
AST: 29 (ref 13–35)
Alkaline Phosphatase: 72 (ref 25–125)
Bilirubin, Total: 0.9

## 2021-08-21 LAB — BASIC METABOLIC PANEL
BUN: 45 — AB (ref 4–21)
CO2: 24 — AB (ref 13–22)
Chloride: 101 (ref 99–108)
Creatinine: 2.1 — AB (ref 0.5–1.1)
Glucose: 88
Potassium: 4.5 mEq/L (ref 3.5–5.1)
Sodium: 138 (ref 137–147)

## 2021-08-21 LAB — LACTATE DEHYDROGENASE: LDH: 160 U/L (ref 98–192)

## 2021-08-21 LAB — COMPREHENSIVE METABOLIC PANEL
Albumin: 4.7 (ref 3.5–5.0)
Calcium: 9.6 (ref 8.7–10.7)

## 2021-08-21 LAB — CBC: RBC: 3.76 — AB (ref 3.87–5.11)

## 2021-08-21 NOTE — Telephone Encounter (Signed)
Per 08/21/21 los next appt scheduled and confirmed with patient ?

## 2021-08-21 NOTE — Telephone Encounter (Signed)
08/21/21 Spoke with patient and scheduled lab appt ?

## 2022-02-02 ENCOUNTER — Telehealth: Payer: Self-pay | Admitting: Oncology

## 2022-02-02 NOTE — Telephone Encounter (Signed)
Notified patient of upcoming scan and follow up appt dates, times and instructions.

## 2022-02-16 LAB — COMPREHENSIVE METABOLIC PANEL
Albumin: 4.4 (ref 3.5–5.0)
Calcium: 9.5 (ref 8.7–10.7)
eGFR: 26

## 2022-02-16 LAB — BASIC METABOLIC PANEL
BUN: 26 — AB (ref 4–21)
CO2: 26 — AB (ref 13–22)
Chloride: 104 (ref 99–108)
Creatinine: 1.9 — AB (ref 0.5–1.1)
Glucose: 92
Potassium: 4.1 mEq/L (ref 3.5–5.1)
Sodium: 136 — AB (ref 137–147)

## 2022-02-16 LAB — HEPATIC FUNCTION PANEL
ALT: 23 U/L (ref 7–35)
AST: 26 (ref 13–35)
Alkaline Phosphatase: 76 (ref 25–125)
Bilirubin, Total: 0.9

## 2022-02-16 LAB — CBC AND DIFFERENTIAL
HCT: 35 — AB (ref 36–46)
Hemoglobin: 12 (ref 12.0–16.0)
Neutrophils Absolute: 2.25
Platelets: 183 10*3/uL (ref 150–400)
WBC: 4.5

## 2022-02-16 LAB — CBC: RBC: 3.7 — AB (ref 3.87–5.11)

## 2022-02-18 NOTE — Progress Notes (Signed)
[place]  [address] [place] [employer],    [address] [phone]  Clinic Day: 02/19/22  Referring physician: Maris Berger, MD  CHIEF COMPLAINT:  CC: Stage IIIB follicular lymphoma  Current Treatment: Observation    HISTORY OF PRESENT ILLNESS:  [name] is a 76 y.o. 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 She presented with significant upper abdominal adenopathy, with associated weight loss and malnutrition.  She was initially treated on a clinical trial and received bendamustine, ofatumumab, and bortezomib.  She received 1 cycle with an excellent response, but had severe toxicities and required hospitalization for IV fluids and antiemetics.  Chemotherapy was discontinued, but she did well for over a year with no further therapy.  In August 2013, she had progressive disease and was treated with bendamustine and rituximab for 6 cycles completed in April 2014 with a good response.  She has not required treatment since then.  Colonoscopy and endoscopy in May 2016 revealed diverticulosis and a hiatal hernia.    She was lost to follow-up between March 2017 and October 2018.  She had multiple health issues during that interval, including back surgery followed by osteomyelitis and diskitis.  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  She apparently got to the point where she could not ambulate or think straight, and had a prolonged hospitalization in [facility] followed by a stay in rehabilitation. CT chest, abdomen and pelvis in April 2019 revealed stable retroperitoneal soft tissue mass.  CT was done without contrast due to chronic kidney disease.  Repeat CT imaging in June 2020 was again stable.  Mammogram in November 2020 did not reveal any evidence of malignancy.  CT head in December 2020 was stable with VP shunt in place.   CT chest, abdomen and  pelvis in September 2021 revealed unchanged post treatment appearance of celiac axis/gastrohepatic ligament lymph nodes.  No other evidence of mass or lymphadenopathy in the chest, abdomen or pelvis. She had a Cologuard test which came back abnormal and was scheduled for colonoscopy with Dr. Noberto Retort. Review of her records shows she underwent colonoscopy in October and was found to have tubular adenomas of the rectum, and underwent transanal excision of this. Surgical pathology did not reveal any high-grade dysplasia or malignancy. She states she is now followed by Dr. Olivia Mackie, nephrologist, in [place] for chronic kidney disease. Bilateral  Screening mammogram in January of 2022 did not reveal any evidence of malignancy. CT chest, abdomen and pelvis done in September 2022 revealed stable to mildly improved exam with no new or progressive findings. She has stable post treatment related changes about the celiac region, and stable appearance of gastric thickening, perhaps related to post treatment changes.  INTERVAL HISTORY:  Sharon Griffin is here for routine follow up and states that she has been well. She was anemic with a hemoglobin of 11.8, and it is now up to 12.0.  Chemistries are unremarkable except for a creatinine of 1.9, down from 2.1, with a BUN of 26.  She had a CT of abdomen and pelvis and once again this is stable with a soft tissue mass in the upper retroperitoneum, consistent with her treated lymphoma. Her  appetite is good, and her weight is up 3 pounds since her last visit.  She denies fever, chills or other signs of infection.  She denies nausea, vomiting, bowel issues, or abdominal pain.  She  denies sore throat, cough, dyspnea, or chest pain.  REVIEW OF SYSTEMS:and  Review of Systems  Constitutional: Negative.  Negative for appetite change, chills, fatigue, fever and unexpected weight change.  HENT:  Negative.    Eyes: Negative.   Respiratory: Negative.  Negative for chest tightness, cough,  hemoptysis, shortness of breath and wheezing.   Cardiovascular: Negative.  Negative for chest pain, leg swelling and palpitations.  Gastrointestinal: Negative.  Negative for abdominal distention, abdominal pain, blood in stool, constipation, diarrhea, nausea and vomiting.  Endocrine: Negative.   Genitourinary: Negative.  Negative for difficulty urinating, dysuria, frequency and hematuria.   Musculoskeletal: Negative.  Negative for arthralgias, back pain, flank pain, gait problem and myalgias.  Skin: Negative.   Neurological: Negative.  Negative for dizziness, extremity weakness, gait problem, headaches, light-headedness, numbness, seizures and speech difficulty.  Hematological: Negative.   Psychiatric/Behavioral: Negative.  Negative for depression and sleep disturbance. The patient is not nervous/anxious.      VITALS:  Blood pressure (!) 185/73, pulse 65, temperature (!) 97.4 F (36.3 C), temperature source Oral, resp. rate 16, height '5\' 2"'$  (1.575 m), weight 137 lb 8 oz (62.4 kg), SpO2 98 %.  Wt Readings from Last 3 Encounters:  02/19/22 137 lb 8 oz (62.4 kg)  08/21/21 134 lb 9.6 oz (61.1 kg)  02/02/21 137 lb 3.2 oz (62.2 kg)    Body mass index is 25.15 kg/m.  Performance status (ECOG): 0 - Asymptomatic  PHYSICAL EXAM:  Physical Exam Constitutional:      General: She is not in acute distress.    Appearance: Normal appearance. She is normal weight.  HENT:     Head: Normocephalic and atraumatic.  Eyes:     General: No scleral icterus.    Extraocular Movements: Extraocular movements intact.     Conjunctiva/sclera: Conjunctivae normal.     Pupils: Pupils are equal, round, and reactive to light.  Cardiovascular:     Rate and Rhythm: Normal rate and regular rhythm.     Pulses: Normal pulses.     Heart sounds: Normal heart sounds. No murmur heard.    No friction rub. No gallop.  Pulmonary:     Effort: Pulmonary effort is normal. No respiratory distress.     Breath sounds: Normal  breath sounds.  Abdominal:     General: Bowel sounds are normal. There is no distension.     Palpations: Abdomen is soft. There is no hepatomegaly, splenomegaly or mass.     Tenderness: There is no abdominal tenderness.  Musculoskeletal:        General: Normal range of motion.     Cervical back: Normal range of motion and neck supple.     Right lower leg: No edema.     Left lower leg: No edema.  Lymphadenopathy:     Cervical: No cervical adenopathy.  Skin:    General: Skin is warm and dry.  Neurological:     General: No focal deficit present.     Mental Status: She is alert and oriented to person, place, and time. Mental status is at baseline.  Psychiatric:        Mood and Affect: Mood normal.        Behavior: Behavior normal.        Thought Content: Thought content normal.        Judgment: Judgment normal.    LABS:      Latest Ref Rng & Units 02/16/2022   12:00 AM 08/21/2021   12:00 AM 02/01/2021  12:00 AM  CBC  WBC  4.5     6.8     5.0   Hemoglobin 12.0 - 16.0 12.0     11.8     11.8   Hematocrit 36 - 46 35     36     34   Platelets 150 - 400 K/uL 183     195     182      This result is from an external source.      Latest Ref Rng & Units 02/16/2022   12:00 AM 08/21/2021   12:00 AM 02/01/2021   12:00 AM  CMP  BUN 4 - 21 26     45     37   Creatinine 0.5 - 1.1 1.9     2.1     2.2   Sodium 137 - 147 136     138     135   Potassium 3.5 - 5.1 mEq/L 4.1     4.5     5.3   Chloride 99 - 108 104     101     100   CO2 13 - '22 26     24     26   '$ Calcium 8.7 - 10.7 9.5     9.6     9.7   Alkaline Phos 25 - 125 76     72     72   AST 13 - 35 26     29     32   ALT 7 - 35 U/L '23     21     22      '$ This result is from an external source.    Lab Results  Component Value Date   LDH 160 08/21/2021   LDH 169 08/02/2020    STUDIES:    EXAM: 07/12/21 DIGITAL SCREENING BILATERAL MAMMOGRAM WITH TOMOSYNTHESIS AND CAD  TECHNIQUE:  Bilateral screening digital craniocaudal and  mediolateral oblique  mammograms were obtained. Bilateral screening digital breast  tomosynthesis was performed. The images were evaluated with  computer-aided detection.  COMPARISON: Previous exam(s).  ACR Breast Density Category b: There are scattered areas of  fibroglandular density.   FINDINGS:  There are no findings suspicious for malignancy.  IMPRESSION:  No mammographic evidence of malignancy. A result letter of this  screening mammogram will be mailed directly to the patient.   RECOMMENDATION:  Screening mammogram in one year. (Code:SM-B-01Y)  BI-RADS CATEGORY 1: Negative.  Electronically Signed  By: Dorise Bullion III M.D.  On: 07/12/2021 19:40   HISTORY:   Allergies:  Allergies  Allergen Reactions   Azithromycin Hives    During hospital admission at [facility] 12/2015- med DC due to occurrence of questionable V-Tach versus Torsades.   Codeine Other (See Comments)    Unknown    Current Medications: Current Outpatient Medications  Medication Sig Dispense Refill   albuterol (VENTOLIN HFA) 108 (90 Base) MCG/ACT inhaler Inhale 2 puffs into the lungs every 4 (four) hours as needed.     amLODipine (NORVASC) 10 MG tablet Take 10 mg by mouth daily.     atorvastatin (LIPITOR) 10 MG tablet Take 10 mg by mouth every evening.     calcium-vitamin D (OSCAL WITH D) 500-200 MG-UNIT TABS tablet Take 1 tablet by mouth 2 (two) times daily.     cholecalciferol (VITAMIN D) 1000 units tablet Take 1,000 Units by mouth at bedtime.     docusate sodium (COLACE) 100 MG capsule Take  100 mg by mouth every 12 (twelve) hours as needed for mild constipation.     escitalopram (LEXAPRO) 10 MG tablet Take 10 mg by mouth daily.     famotidine (PEPCID) 40 MG tablet Take 40 mg by mouth daily.     levothyroxine (SYNTHROID) 50 MCG tablet Take by mouth.     lidocaine-prilocaine (EMLA) cream Apply 1 application topically as needed (for port).     lisinopril (ZESTRIL) 5 MG tablet Take by mouth.     Multiple  Vitamin (MULTI-VITAMINS) TABS Take by mouth.     ondansetron (ZOFRAN) 4 MG tablet Take 4 mg by mouth every 6 (six) hours as needed for nausea or vomiting.     pantoprazole (PROTONIX) 40 MG tablet Take 40 mg by mouth daily.     No current facility-administered medications for this visit.     ASSESSMENT & PLAN:   Assessment:   History of low-grade follicular lymphoma, diagnosed in March 2012.  She has not required treatment in years.  She remains without evidence of recurrent/progressive disease. CT of abdomen and pelvis reveals a stable retroperitoneal soft tissue mass consistent with treated lymphoma.  Chronic kidney disease, stable. She continues to follow with a nephrologist.  Plan:     We will plan to see her back in 6 months with a CBC, CMP, and LDH  for repeat evaluation.  We are now doing scans yearly and they will need to be without contrast. The patient understands the plans discussed today and is in agreement with them.  She knows to contact our office if she develops concerns prior to her next appointment.

## 2022-02-19 ENCOUNTER — Inpatient Hospital Stay: Payer: PPO | Attending: Oncology | Admitting: Oncology

## 2022-02-19 ENCOUNTER — Encounter: Payer: Self-pay | Admitting: Oncology

## 2022-02-19 ENCOUNTER — Other Ambulatory Visit: Payer: Self-pay | Admitting: Oncology

## 2022-02-19 VITALS — BP 185/73 | HR 65 | Temp 97.4°F | Resp 16 | Ht 62.0 in | Wt 137.5 lb

## 2022-02-19 DIAGNOSIS — C8218 Follicular lymphoma grade II, lymph nodes of multiple sites: Secondary | ICD-10-CM | POA: Diagnosis not present

## 2022-02-19 DIAGNOSIS — D631 Anemia in chronic kidney disease: Secondary | ICD-10-CM

## 2022-02-19 DIAGNOSIS — N184 Chronic kidney disease, stage 4 (severe): Secondary | ICD-10-CM | POA: Diagnosis not present

## 2022-02-21 ENCOUNTER — Encounter: Payer: Self-pay | Admitting: Oncology

## 2022-08-17 NOTE — Progress Notes (Signed)
[facility]  [address] [place],  [place]  [address] [phone]  Clinic Day:08/21/22  Referring physician: Everlean Cherry, MD  CHIEF COMPLAINT:  CC: Stage IIIB follicular lymphoma  Current Treatment: Observation   HISTORY OF PRESENT ILLNESS:  Sharon Griffin is a 77 y.o. 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 She presented with significant upper abdominal adenopathy, with associated weight loss and malnutrition.  She was initially treated on a clinical trial and received bendamustine, ofatumumab, and bortezomib.  She received 1 cycle with an excellent response, but had severe toxicities and required hospitalization for IV fluids and antiemetics.  Chemotherapy was discontinued, but she did well for over a year with no further therapy.  In August 2013, she had progressive disease and was treated with bendamustine and rituximab for 6 cycles completed in April 2014 with a good response.  She has not required treatment since then.  Colonoscopy and endoscopy in May 2016 revealed diverticulosis and a hiatal hernia.    She was lost to follow-up between March 2017 and October 2018.  She had multiple health issues during that interval, including back surgery followed by osteomyelitis and diskitis.  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  She apparently got to the point where she could not ambulate or think straight, and had a prolonged hospitalization in [facility] followed by a stay in rehabilitation. CT chest, abdomen and pelvis in April 2019 revealed stable retroperitoneal soft tissue mass.  CT was done without contrast due to chronic kidney disease.  Repeat CT imaging in June 2020 was again stable.  Mammogram in November 2020 did not reveal any evidence of malignancy.  CT head in December 2020 was stable with VP shunt in place.   CT chest, abdomen and  pelvis in September 2021 revealed unchanged post treatment appearance of celiac axis/gastrohepatic ligament lymph nodes.  No other evidence of mass or lymphadenopathy in the chest, abdomen or pelvis. She had a Cologuard test which came back abnormal and was scheduled for colonoscopy with Dr. Georgiana Shore. Review of her records shows she underwent colonoscopy in October and was found to have tubular adenomas of the rectum, and underwent transanal excision of this. Surgical pathology did not reveal any high-grade dysplasia or malignancy. She states she is now followed by Dr. Lequita Halt, nephrologist, in [place] for chronic kidney disease. Bilateral  Screening mammogram in January of 2022 did not reveal any evidence of malignancy. CT chest, abdomen and pelvis done in September 2022 revealed stable to mildly improved exam with no new or progressive findings. She has stable post treatment related changes about the celiac region, and stable appearance of gastric thickening, perhaps related to post treatment changes.  INTERVAL HISTORY:  Sharon Griffin is here for routine follow up for stage IIIB follicular lymphoma. Patient states that she feels well and she has no complaints of pain today. She will see the nephrologist tomorrow. She will receive CT scans without contrast yearly now. Her labs today are pending. I will see her back in 6 months with CBC, CMP, and noncontrast CT scan of chest, abdomen, and pelvis. She denies signs of infection such as sore throat, sinus drainage, cough, or urinary symptoms.  She denies fevers or recurrent chills. She denies pain. She denies nausea, vomiting, chest pain, dyspnea or cough. Her appetite is good and her weight has decreased 1 pounds over last 6 months .  REVIEW OF SYSTEMS:and  Review of Systems  Constitutional: Negative.  Negative for appetite change, chills, diaphoresis, fatigue, fever and unexpected weight change.  HENT:  Negative.  Negative for hearing loss, lump/mass, mouth  sores, nosebleeds, sore throat, tinnitus, trouble swallowing and voice change.   Eyes: Negative.  Negative for eye problems and icterus.  Respiratory: Negative.  Negative for chest tightness, cough, hemoptysis, shortness of breath and wheezing.   Cardiovascular: Negative.  Negative for chest pain, leg swelling and palpitations.  Gastrointestinal: Negative.  Negative for abdominal distention, abdominal pain, blood in stool, constipation, diarrhea, nausea, rectal pain and vomiting.  Endocrine: Negative.   Genitourinary: Negative.  Negative for bladder incontinence, difficulty urinating, dyspareunia, dysuria, frequency, hematuria, menstrual problem, nocturia, pelvic pain, vaginal bleeding and vaginal discharge.   Musculoskeletal: Negative.  Negative for arthralgias, back pain, flank pain, gait problem, myalgias, neck pain and neck stiffness.  Skin: Negative.  Negative for itching, rash and wound.  Neurological: Negative.  Negative for dizziness, extremity weakness, gait problem, headaches, light-headedness, numbness, seizures and speech difficulty.  Hematological: Negative.  Negative for adenopathy. Does not bruise/bleed easily.  Psychiatric/Behavioral: Negative.  Negative for confusion, decreased concentration, depression, sleep disturbance and suicidal ideas. The patient is not nervous/anxious.      VITALS:  Blood pressure 125/70, pulse 64, temperature 97.9 F (36.6 C), temperature source Oral, resp. rate 16, weight 136 lb 9.6 oz (62 kg), SpO2 97 %.  Wt Readings from Last 3 Encounters:  08/21/22 136 lb 9.6 oz (62 kg)  02/19/22 137 lb 8 oz (62.4 kg)  08/21/21 134 lb 9.6 oz (61.1 kg)    Body mass index is 24.98 kg/m.  Performance status (ECOG): 0 - Asymptomatic  PHYSICAL EXAM:  Physical Exam Vitals and nursing note reviewed.  Constitutional:      General: She is not in acute distress.    Appearance: Normal appearance. She is normal weight. She is not ill-appearing, toxic-appearing or  diaphoretic.  HENT:     Head: Normocephalic and atraumatic.     Right Ear: Tympanic membrane, ear canal and external ear normal. There is no impacted cerumen.     Left Ear: Tympanic membrane, ear canal and external ear normal. There is no impacted cerumen.     Nose: Nose normal. No congestion or rhinorrhea.     Mouth/Throat:     Mouth: Mucous membranes are moist.     Pharynx: Oropharynx is clear. No oropharyngeal exudate or posterior oropharyngeal erythema.  Eyes:     General: No scleral icterus.       Right eye: No discharge.        Left eye: No discharge.     Extraocular Movements: Extraocular movements intact.     Conjunctiva/sclera: Conjunctivae normal.     Pupils: Pupils are equal, round, and reactive to light.  Neck:     Vascular: No carotid bruit.  Cardiovascular:     Rate and Rhythm: Normal rate and regular rhythm.     Pulses: Normal pulses.     Heart sounds: Murmur (1/6 systolic murmur) heard.     No friction rub. No gallop.  Pulmonary:     Effort: Pulmonary effort is normal. No respiratory distress.     Breath sounds: Normal breath sounds. No stridor. No wheezing, rhonchi or rales.  Chest:     Chest wall: No tenderness.  Abdominal:     General: Bowel sounds are normal. There is no distension.     Palpations: Abdomen is soft. There is no hepatomegaly,  splenomegaly or mass.     Tenderness: There is no abdominal tenderness. There is no right CVA tenderness, left CVA tenderness, guarding or rebound.     Hernia: No hernia is present.  Musculoskeletal:        General: No swelling, tenderness, deformity or signs of injury. Normal range of motion.     Cervical back: Normal range of motion and neck supple. No rigidity or tenderness.     Right lower leg: No edema.     Left lower leg: No edema.  Lymphadenopathy:     Cervical: No cervical adenopathy.  Skin:    General: Skin is warm and dry.     Coloration: Skin is not jaundiced or pale.     Findings: No bruising, erythema,  lesion or rash.  Neurological:     General: No focal deficit present.     Mental Status: She is alert and oriented to person, place, and time. Mental status is at baseline.     Cranial Nerves: No cranial nerve deficit.     Sensory: No sensory deficit.     Motor: No weakness.     Coordination: Coordination normal.     Gait: Gait normal.     Deep Tendon Reflexes: Reflexes normal.  Psychiatric:        Mood and Affect: Mood normal.        Behavior: Behavior normal.        Thought Content: Thought content normal.        Judgment: Judgment normal.    LABS:      Latest Ref Rng & Units 08/21/2022    1:18 PM 02/16/2022   12:00 AM 08/21/2021   12:00 AM  CBC  WBC 4.0 - 10.5 K/uL 5.4  4.5     6.8      Hemoglobin 12.0 - 15.0 g/dL 16.1  09.6     04.5      Hematocrit 36.0 - 46.0 % 38.4  35     36      Platelets 150 - 400 K/uL 188  183     195         This result is from an external source.      Latest Ref Rng & Units 08/21/2022    1:18 PM 02/16/2022   12:00 AM 08/21/2021   12:00 AM  CMP  Glucose 70 - 99 mg/dL 409     BUN 8 - 23 mg/dL 66  26     45      Creatinine 0.44 - 1.00 mg/dL 8.11  1.9     2.1      Sodium 135 - 145 mmol/L 137  136     138      Potassium 3.5 - 5.1 mmol/L 4.1  4.1     4.5      Chloride 98 - 111 mmol/L 99  104     101      CO2 22 - 32 mmol/L 28  26     24       Calcium 8.9 - 10.3 mg/dL 9.6  9.5     9.6      Total Protein 6.5 - 8.1 g/dL 8.0     Total Bilirubin 0.3 - 1.2 mg/dL 0.8     Alkaline Phos 38 - 126 U/L 68  76     72      AST 15 - 41 U/L 21  26     29  ALT 0 - 44 U/L 18  23     21          This result is from an external source.   Component Ref Range & Units 03/22/2023  TSH 0.45 - 5.00 UIU/ML 3.47   Component Ref Range & Units 03/22/2023  LDL Direct <100 mg/dL 71  Total Cholesterol <161 MG/DL 096  Triglycerides <045 MG/DL 409  HDL Cholesterol >=81 MG/DL 70  Total Chol / HDL Cholesterol <4.5 2.5  Non-HDL Cholesterol MG/DL 191   Lab Results   Component Value Date   LDH 159 08/21/2022   LDH 160 08/21/2021   LDH 169 08/02/2020    STUDIES:      EXAM: 07/12/21 DIGITAL SCREENING BILATERAL MAMMOGRAM WITH TOMOSYNTHESIS AND CAD  TECHNIQUE:  Bilateral screening digital craniocaudal and mediolateral oblique  mammograms were obtained. Bilateral screening digital breast  tomosynthesis was performed. The images were evaluated with  computer-aided detection.  COMPARISON: Previous exam(s).  ACR Breast Density Category b: There are scattered areas of  fibroglandular density.   FINDINGS:  There are no findings suspicious for malignancy.  IMPRESSION:  No mammographic evidence of malignancy. A result letter of this  screening mammogram will be mailed directly to the patient.   RECOMMENDATION:  Screening mammogram in one year. (Code:SM-B-01Y)  BI-RADS CATEGORY 1: Negative.  Electronically Signed  By: Gerome Sam III M.D.  On: 07/12/2021 19:40   HISTORY:   Allergies:  Allergies  Allergen Reactions   Azithromycin Hives    During hospital admission at [facility] 12/2015- med DC due to occurrence of questionable V-Tach versus Torsades.   Codeine Nausea And Vomiting and Rash    Unknown    Current Medications: Current Outpatient Medications  Medication Sig Dispense Refill   Calcium Carb-Cholecalciferol (OYSTER SHELL CALCIUM W/D) 500-5 MG-MCG TABS Take by mouth.     furosemide (LASIX) 20 MG tablet Take by mouth.     amLODipine (NORVASC) 10 MG tablet Take 10 mg by mouth daily.     atorvastatin (LIPITOR) 10 MG tablet Take 10 mg by mouth every evening.     calcium-vitamin D (OSCAL WITH D) 500-200 MG-UNIT TABS tablet Take 1 tablet by mouth 2 (two) times daily.     cholecalciferol (VITAMIN D) 1000 units tablet Take 1,000 Units by mouth at bedtime.     docusate sodium (COLACE) 100 MG capsule Take 100 mg by mouth every 12 (twelve) hours as needed for mild constipation.     escitalopram (LEXAPRO) 10 MG tablet Take 10 mg by mouth  daily.     famotidine (PEPCID) 40 MG tablet Take 40 mg by mouth daily.     levothyroxine (SYNTHROID) 50 MCG tablet Take by mouth.     lidocaine-prilocaine (EMLA) cream Apply 1 application topically as needed (for port).     lisinopril (ZESTRIL) 5 MG tablet Take by mouth.     Multiple Vitamin (MULTI-VITAMINS) TABS Take by mouth.     ondansetron (ZOFRAN) 4 MG tablet Take 4 mg by mouth every 6 (six) hours as needed for nausea or vomiting.     pantoprazole (PROTONIX) 40 MG tablet Take 40 mg by mouth daily.     No current facility-administered medications for this visit.     ASSESSMENT & PLAN:  Assessment:   History of low-grade follicular lymphoma, diagnosed in March 2012.  She has not required treatment in years.  She remains without evidence of recurrent/progressive disease. CT of abdomen and pelvis reveals a stable retroperitoneal soft tissue mass consistent with  treated lymphoma.  Chronic kidney disease, stable. She continues to follow with a nephrologist.  Plan:     She will see the nephrologist tomorrow 08/22/2022. She will receive CT scans without contrast yearly now. Her labs today are pending. I will see her back in 6 months with CBC, CMP, and noncontrast CT scan of chest, abdomen, and pelvis. The patient understands the plans discussed today and is in agreement with them.  She knows to contact our office if she develops concerns prior to her next appointment.  I provided 13 minutes of face-to-face time during this this encounter and > 50% was spent counseling as documented under my assessment and plan.    I,Jasmine M Lassiter,acting as a scribe for Dellia Beckwith, MD.,have documented all relevant documentation on the behalf of Dellia Beckwith, MD,as directed by  Dellia Beckwith, MD while in the presence of Dellia Beckwith, MD.

## 2022-08-20 ENCOUNTER — Ambulatory Visit: Payer: PRIVATE HEALTH INSURANCE | Admitting: Oncology

## 2022-08-20 ENCOUNTER — Other Ambulatory Visit: Payer: PRIVATE HEALTH INSURANCE

## 2022-08-21 ENCOUNTER — Telehealth: Payer: Self-pay | Admitting: Oncology

## 2022-08-21 ENCOUNTER — Inpatient Hospital Stay (INDEPENDENT_AMBULATORY_CARE_PROVIDER_SITE_OTHER): Payer: PPO | Admitting: Oncology

## 2022-08-21 ENCOUNTER — Inpatient Hospital Stay: Payer: PPO | Attending: Oncology

## 2022-08-21 ENCOUNTER — Other Ambulatory Visit: Payer: Self-pay | Admitting: Oncology

## 2022-08-21 VITALS — BP 125/70 | HR 64 | Temp 97.9°F | Resp 16 | Wt 136.6 lb

## 2022-08-21 DIAGNOSIS — C8218 Follicular lymphoma grade II, lymph nodes of multiple sites: Secondary | ICD-10-CM

## 2022-08-21 DIAGNOSIS — N189 Chronic kidney disease, unspecified: Secondary | ICD-10-CM | POA: Diagnosis not present

## 2022-08-21 DIAGNOSIS — Z8572 Personal history of non-Hodgkin lymphomas: Secondary | ICD-10-CM | POA: Diagnosis present

## 2022-08-21 LAB — CMP (CANCER CENTER ONLY)
ALT: 18 U/L (ref 0–44)
AST: 21 U/L (ref 15–41)
Albumin: 4.8 g/dL (ref 3.5–5.0)
Alkaline Phosphatase: 68 U/L (ref 38–126)
Anion gap: 10 (ref 5–15)
BUN: 66 mg/dL — ABNORMAL HIGH (ref 8–23)
CO2: 28 mmol/L (ref 22–32)
Calcium: 9.6 mg/dL (ref 8.9–10.3)
Chloride: 99 mmol/L (ref 98–111)
Creatinine: 2.92 mg/dL — ABNORMAL HIGH (ref 0.44–1.00)
GFR, Estimated: 16 mL/min — ABNORMAL LOW (ref >60)
Glucose, Bld: 107 mg/dL — ABNORMAL HIGH (ref 70–99)
Potassium: 4.1 mmol/L (ref 3.5–5.1)
Sodium: 137 mmol/L (ref 135–145)
Total Bilirubin: 0.8 mg/dL (ref 0.3–1.2)
Total Protein: 8 g/dL (ref 6.5–8.1)

## 2022-08-21 LAB — LACTATE DEHYDROGENASE: LDH: 159 U/L (ref 98–192)

## 2022-08-21 LAB — CBC WITH DIFFERENTIAL (CANCER CENTER ONLY)
Abs Immature Granulocytes: 0.01 10*3/uL (ref 0.00–0.07)
Basophils Absolute: 0 10*3/uL (ref 0.0–0.1)
Basophils Relative: 1 %
Eosinophils Absolute: 0.3 10*3/uL (ref 0.0–0.5)
Eosinophils Relative: 5 %
HCT: 38.4 % (ref 36.0–46.0)
Hemoglobin: 12.5 g/dL (ref 12.0–15.0)
Immature Granulocytes: 0 %
Lymphocytes Relative: 37 %
Lymphs Abs: 2 10*3/uL (ref 0.7–4.0)
MCH: 31.5 pg (ref 26.0–34.0)
MCHC: 32.6 g/dL (ref 30.0–36.0)
MCV: 96.7 fL (ref 80.0–100.0)
Monocytes Absolute: 0.6 10*3/uL (ref 0.1–1.0)
Monocytes Relative: 11 %
Neutro Abs: 2.5 10*3/uL (ref 1.7–7.7)
Neutrophils Relative %: 46 %
Platelet Count: 188 10*3/uL (ref 150–400)
RBC: 3.97 MIL/uL (ref 3.87–5.11)
RDW: 13.2 % (ref 11.5–15.5)
WBC Count: 5.4 10*3/uL (ref 4.0–10.5)
nRBC: 0 % (ref 0.0–0.2)

## 2022-08-21 NOTE — Telephone Encounter (Signed)
08/21/22 Next appt scheduled and confirmed with patient 

## 2022-09-03 ENCOUNTER — Telehealth: Payer: Self-pay

## 2022-09-03 NOTE — Telephone Encounter (Signed)
-----   Message from Dellia Beckwith, MD sent at 08/22/2022  8:11 PM EDT ----- Regarding: call Her kidney function is considerably worse than last time but every thing else is good.

## 2022-09-07 ENCOUNTER — Encounter: Payer: Self-pay | Admitting: Oncology

## 2023-02-19 NOTE — Progress Notes (Signed)
[facility]  [address] [place],  [place]  [address] [phone]  Clinic Day: 02/20/23  Referring physician: Everlean Cherry, MD  CHIEF COMPLAINT:  CC: Stage IIIB follicular lymphoma  Current Treatment: Observation   HISTORY OF PRESENT ILLNESS:  Sharon Griffin is a 77 y.o. 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 She presented with significant upper abdominal adenopathy, with associated weight loss and malnutrition.  She was initially treated on a clinical trial and received bendamustine, ofatumumab, and bortezomib.  She received 1 cycle with an excellent response, but had severe toxicities and required hospitalization for IV fluids and antiemetics.  Chemotherapy was discontinued, but she did well for over a year with no further therapy.  In August 2013, she had progressive disease and was treated with bendamustine and rituximab for 6 cycles completed in April 2014 with a good response.  She has not required treatment since then.  Colonoscopy and endoscopy in May 2016 revealed diverticulosis and a hiatal hernia.    She was lost to follow-up between March 2017 and October 2018.  She had multiple health issues during that interval, including back surgery followed by osteomyelitis and diskitis.  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  She apparently got to the point where she could not ambulate or think straight, and had a prolonged hospitalization in [facility] followed by a stay in rehabilitation. CT chest, abdomen and pelvis in April 2019 revealed stable retroperitoneal soft tissue mass.  CT was done without contrast due to chronic kidney disease.  Repeat CT imaging in June 2020 was again stable.  Mammogram in November 2020 did not reveal any evidence of malignancy.  CT head in December 2020 was stable with VP shunt in place.   CT chest, abdomen and  pelvis in September 2021 revealed unchanged post treatment appearance of celiac axis/gastrohepatic ligament lymph nodes.  No other evidence of mass or lymphadenopathy in the chest, abdomen or pelvis. She had a Cologuard test which came back abnormal and was scheduled for colonoscopy with Dr. Georgiana Shore. Review of her records shows she underwent colonoscopy in October and was found to have tubular adenomas of the rectum, and underwent transanal excision of this. Surgical pathology did not reveal any high-grade dysplasia or malignancy. She states she is now followed by Dr. Lequita Halt, nephrologist, in [place] for chronic kidney disease. Bilateral  Screening mammogram in January of 2022 did not reveal any evidence of malignancy. CT chest, abdomen and pelvis done in September 2022 revealed stable to mildly improved exam with no new or progressive findings. She has stable post treatment related changes about the celiac region, and stable appearance of gastric thickening, perhaps related to post treatment changes.  INTERVAL HISTORY:  Sharon Griffin is here for routine follow up for stage IIIB follicular lymphoma originally diagnosed in March, 2012. Patient states that she feels well and has no complaints of pain. She continues torsemide 5 mg without difficulty and no longer takes furosemide 20 mg.  She had a CT chest, abdomen, and pelvis done on 02/18/2023 that revealed stable prominent soft tissue in the upper retroperitoneum, presumably reflecting treated lymphoma, and no new or progressive lymphadenopathy in the chest, abdomen, and pelvis or splenomegaly. Her WBC was 6.2, hemoglobin was 12.9, and platelet count was 206,000 as of 02/18/2023. She has a elevated creatinine of 2.10 with a BUN of 41 and a mildly low sodium  of 135. The rest of her CMP is normal. I will see her back in 6 months with CBC, CMP, and LDH. She denies signs of infection such as sore throat, sinus drainage, cough, or urinary symptoms.  She denies fevers  or recurrent chills. She denies pain. She denies nausea, vomiting, chest pain, dyspnea or cough. Her appetite is good and her weight has increased 1 pounds over last 6 month .  REVIEW OF SYSTEMS:and  Review of Systems  Constitutional: Negative.  Negative for appetite change, chills, diaphoresis, fatigue, fever and unexpected weight change.  HENT:  Negative.  Negative for hearing loss, lump/mass, mouth sores, nosebleeds, sore throat, tinnitus, trouble swallowing and voice change.   Eyes: Negative.  Negative for eye problems and icterus.  Respiratory: Negative.  Negative for chest tightness, cough, hemoptysis, shortness of breath and wheezing.   Cardiovascular: Negative.  Negative for chest pain, leg swelling and palpitations.  Gastrointestinal: Negative.  Negative for abdominal distention, abdominal pain, blood in stool, constipation, diarrhea, nausea, rectal pain and vomiting.  Endocrine: Negative.   Genitourinary: Negative.  Negative for bladder incontinence, difficulty urinating, dyspareunia, dysuria, frequency, hematuria, menstrual problem, nocturia, pelvic pain, vaginal bleeding and vaginal discharge.   Musculoskeletal: Negative.  Negative for arthralgias, back pain, flank pain, gait problem, myalgias, neck pain and neck stiffness.  Skin: Negative.  Negative for itching, rash and wound.  Neurological: Negative.  Negative for dizziness, extremity weakness, gait problem, headaches, light-headedness, numbness, seizures and speech difficulty.  Hematological: Negative.  Negative for adenopathy. Does not bruise/bleed easily.  Psychiatric/Behavioral: Negative.  Negative for confusion, decreased concentration, depression, sleep disturbance and suicidal ideas. The patient is not nervous/anxious.      VITALS:  Blood pressure (!) 140/60, pulse (!) 57, temperature 97.6 F (36.4 C), temperature source Oral, resp. rate 18, height 5\' 2"  (1.575 m), weight 137 lb 11.2 oz (62.5 kg), SpO2 100%.  Wt Readings  from Last 3 Encounters:  02/20/23 137 lb 11.2 oz (62.5 kg)  08/21/22 136 lb 9.6 oz (62 kg)  02/19/22 137 lb 8 oz (62.4 kg)    Body mass index is 25.19 kg/m.  Performance status (ECOG): 0 - Asymptomatic  PHYSICAL EXAM:  Physical Exam Vitals and nursing note reviewed.  Constitutional:      General: She is not in acute distress.    Appearance: Normal appearance. She is normal weight. She is not ill-appearing, toxic-appearing or diaphoretic.  HENT:     Head: Normocephalic and atraumatic.     Right Ear: Tympanic membrane, ear canal and external ear normal. There is no impacted cerumen.     Left Ear: Tympanic membrane, ear canal and external ear normal. There is no impacted cerumen.     Nose: Nose normal. No congestion or rhinorrhea.     Mouth/Throat:     Mouth: Mucous membranes are moist.     Pharynx: Oropharynx is clear. No oropharyngeal exudate or posterior oropharyngeal erythema.  Eyes:     General: No scleral icterus.       Right eye: No discharge.        Left eye: No discharge.     Extraocular Movements: Extraocular movements intact.     Conjunctiva/sclera: Conjunctivae normal.     Pupils: Pupils are equal, round, and reactive to light.  Neck:     Vascular: No carotid bruit.  Cardiovascular:     Rate and Rhythm: Normal rate and regular rhythm.     Pulses: Normal pulses.     Heart sounds: Murmur (1/6  systolic murmur) heard.     No friction rub. No gallop.  Pulmonary:     Effort: Pulmonary effort is normal. No respiratory distress.     Breath sounds: Normal breath sounds. No stridor. No wheezing, rhonchi or rales.  Chest:     Chest wall: No tenderness.     Comments: Mild firmness in the upper mid abdomen, stable finding.   Abdominal:     General: Bowel sounds are normal. There is no distension.     Palpations: Abdomen is soft. There is no hepatomegaly, splenomegaly or mass.     Tenderness: There is no abdominal tenderness. There is no right CVA tenderness, left CVA  tenderness, guarding or rebound.     Hernia: No hernia is present.  Musculoskeletal:        General: No swelling, tenderness, deformity or signs of injury. Normal range of motion.     Cervical back: Normal range of motion and neck supple. No rigidity or tenderness.     Right lower leg: No edema.     Left lower leg: No edema.  Lymphadenopathy:     Cervical: No cervical adenopathy.  Skin:    General: Skin is warm and dry.     Coloration: Skin is not jaundiced or pale.     Findings: No bruising, erythema, lesion or rash.  Neurological:     General: No focal deficit present.     Mental Status: She is alert and oriented to person, place, and time. Mental status is at baseline.     Cranial Nerves: No cranial nerve deficit.     Sensory: No sensory deficit.     Motor: No weakness.     Coordination: Coordination normal.     Gait: Gait normal.     Deep Tendon Reflexes: Reflexes normal.  Psychiatric:        Mood and Affect: Mood normal.        Behavior: Behavior normal.        Thought Content: Thought content normal.        Judgment: Judgment normal.    LABS:      Latest Ref Rng & Units 08/21/2022    1:18 PM 02/16/2022   12:00 AM 08/21/2021   12:00 AM  CBC  WBC 4.0 - 10.5 K/uL 5.4  4.5     6.8      Hemoglobin 12.0 - 15.0 g/dL 82.9  56.2     13.0      Hematocrit 36.0 - 46.0 % 38.4  35     36      Platelets 150 - 400 K/uL 188  183     195         This result is from an external source.      Latest Ref Rng & Units 08/21/2022    1:18 PM 02/16/2022   12:00 AM 08/21/2021   12:00 AM  CMP  Glucose 70 - 99 mg/dL 865     BUN 8 - 23 mg/dL 66  26     45      Creatinine 0.44 - 1.00 mg/dL 7.84  1.9     2.1      Sodium 135 - 145 mmol/L 137  136     138      Potassium 3.5 - 5.1 mmol/L 4.1  4.1     4.5      Chloride 98 - 111 mmol/L 99  104     101      CO2  22 - 32 mmol/L 28  26     24       Calcium 8.9 - 10.3 mg/dL 9.6  9.5     9.6      Total Protein 6.5 - 8.1 g/dL 8.0     Total Bilirubin 0.3 -  1.2 mg/dL 0.8     Alkaline Phos 38 - 126 U/L 68  76     72      AST 15 - 41 U/L 21  26     29       ALT 0 - 44 U/L 18  23     21          This result is from an external source.   Component Ref Range & Units 6 mo ago (08/21/22) 1 yr ago (08/21/21) 2 yr ago (02/01/21) 2 yr ago (08/02/20)  LDH 98 - 192 U/L 159 160 CM 497 R 169 CM   STUDIES:  Exam:  02/18/23 CT Chest, Abdomen, and Pelvis without Contrast Impression: Stable prominent soft tissue in the upper retroperitoneum presumably reflecting treated lymphoma. No new or progressive lymphadenopathy in the chest, abdomen, and pelvis. No splenomegaly. Aortic Atherosclerosis (ICD10-170.0)  HISTORY:   Allergies:  Allergies  Allergen Reactions   Azithromycin Hives    During hospital admission at [facility] 12/2015- med DC due to occurrence of questionable V-Tach versus Torsades.   Codeine Nausea And Vomiting and Rash    Unknown    Current Medications: Current Outpatient Medications  Medication Sig Dispense Refill   amLODipine (NORVASC) 10 MG tablet Take 10 mg by mouth daily.     atorvastatin (LIPITOR) 10 MG tablet Take 10 mg by mouth every evening.     Calcium Carb-Cholecalciferol (OYSTER SHELL CALCIUM W/D) 500-5 MG-MCG TABS Take by mouth.     calcium-vitamin D (OSCAL WITH D) 500-200 MG-UNIT TABS tablet Take 1 tablet by mouth 2 (two) times daily.     cholecalciferol (VITAMIN D) 1000 units tablet Take 1,000 Units by mouth at bedtime.     docusate sodium (COLACE) 100 MG capsule Take 100 mg by mouth every 12 (twelve) hours as needed for mild constipation.     escitalopram (LEXAPRO) 10 MG tablet Take 10 mg by mouth daily.     famotidine (PEPCID) 40 MG tablet Take 40 mg by mouth daily.     levothyroxine (SYNTHROID) 50 MCG tablet Take by mouth.     lidocaine-prilocaine (EMLA) cream Apply 1 application topically as needed (for port).     lisinopril (ZESTRIL) 5 MG tablet Take by mouth.     Multiple Vitamin (MULTI-VITAMINS) TABS Take by  mouth.     ondansetron (ZOFRAN) 4 MG tablet Take 4 mg by mouth every 6 (six) hours as needed for nausea or vomiting.     pantoprazole (PROTONIX) 40 MG tablet Take 40 mg by mouth daily.     torsemide (DEMADEX) 5 MG tablet Take 5 mg by mouth daily.     No current facility-administered medications for this visit.     ASSESSMENT & PLAN:  Assessment:   History of low-grade follicular lymphoma, diagnosed in March 2012.  She has not required treatment in years.  She remains without evidence of recurrent/progressive disease. CT of abdomen and pelvis reveals a stable retroperitoneal soft tissue mass consistent with treated lymphoma.  Chronic kidney disease, stable. She continues to follow with a nephrologist.  Hydrocephalous, this was diagnosed several years ago and treated with a VP shunt.  Plan:      She  continues torsemide 5 mg without difficulty and no longer takes furosemide 20 mg.  She had a CT chest, abdomen, and pelvis done on 02/18/2023 that revealed stable prominent soft tissue in the upper retroperitoneum presumably reflecting treated lymphoma and no new or progressive lymphadenopathy in the chest, abdomen, and pelvis or splenomegaly. Her WBC was 6.2, hemoglobin was 12.9, and platelet count was 206,000 as of 02/18/2023. She has a elevated creatinine of 2.10 with a BUN of 41 and a mildly low sodium of 135. The rest of her CMP is normal. I will see her back in 6 months with CBC, CMP, and LDH. We will plan to repeat scans in 1 year if all is well. The patient understands the plans discussed today and is in agreement with them.  She knows to contact our office if she develops concerns prior to her next appointment.  I provided 14 minutes of face-to-face time during this this encounter and > 50% was spent counseling as documented under my assessment and plan.   I,Sharon Griffin,acting as a scribe for Dellia Beckwith, MD.,have documented all relevant documentation on the behalf of Dellia Beckwith, MD,as directed by  Dellia Beckwith, MD while in the presence of Dellia Beckwith, MD.

## 2023-02-20 ENCOUNTER — Encounter: Payer: Self-pay | Admitting: Oncology

## 2023-02-20 ENCOUNTER — Inpatient Hospital Stay: Payer: PPO | Attending: Oncology | Admitting: Oncology

## 2023-02-20 ENCOUNTER — Other Ambulatory Visit: Payer: Self-pay | Admitting: Oncology

## 2023-02-20 VITALS — BP 140/60 | HR 57 | Temp 97.6°F | Resp 18 | Ht 62.0 in | Wt 137.7 lb

## 2023-02-20 DIAGNOSIS — C8218 Follicular lymphoma grade II, lymph nodes of multiple sites: Secondary | ICD-10-CM | POA: Diagnosis not present

## 2023-02-20 DIAGNOSIS — D631 Anemia in chronic kidney disease: Secondary | ICD-10-CM

## 2023-02-20 DIAGNOSIS — N184 Chronic kidney disease, stage 4 (severe): Secondary | ICD-10-CM

## 2023-03-08 ENCOUNTER — Encounter: Payer: Self-pay | Admitting: Oncology

## 2023-08-20 ENCOUNTER — Inpatient Hospital Stay: Admitting: Oncology

## 2023-08-20 ENCOUNTER — Inpatient Hospital Stay

## 2023-08-21 ENCOUNTER — Ambulatory Visit: Payer: PPO | Admitting: Oncology

## 2023-08-21 ENCOUNTER — Other Ambulatory Visit: Payer: PPO

## 2024-02-06 ENCOUNTER — Telehealth: Payer: Self-pay | Admitting: Oncology

## 2024-02-06 ENCOUNTER — Other Ambulatory Visit: Payer: Self-pay | Admitting: Oncology

## 2024-02-06 ENCOUNTER — Inpatient Hospital Stay: Attending: Oncology | Admitting: Oncology

## 2024-02-06 ENCOUNTER — Encounter: Payer: Self-pay | Admitting: Oncology

## 2024-02-06 ENCOUNTER — Inpatient Hospital Stay

## 2024-02-06 VITALS — BP 113/59 | HR 65 | Temp 98.2°F | Resp 18 | Ht 62.0 in | Wt 135.8 lb

## 2024-02-06 DIAGNOSIS — Z8572 Personal history of non-Hodgkin lymphomas: Secondary | ICD-10-CM | POA: Diagnosis present

## 2024-02-06 DIAGNOSIS — Z79899 Other long term (current) drug therapy: Secondary | ICD-10-CM | POA: Diagnosis not present

## 2024-02-06 DIAGNOSIS — N189 Chronic kidney disease, unspecified: Secondary | ICD-10-CM | POA: Insufficient documentation

## 2024-02-06 DIAGNOSIS — Z08 Encounter for follow-up examination after completed treatment for malignant neoplasm: Secondary | ICD-10-CM | POA: Insufficient documentation

## 2024-02-06 DIAGNOSIS — Z982 Presence of cerebrospinal fluid drainage device: Secondary | ICD-10-CM | POA: Diagnosis not present

## 2024-02-06 DIAGNOSIS — C8218 Follicular lymphoma grade II, lymph nodes of multiple sites: Secondary | ICD-10-CM

## 2024-02-06 LAB — CBC WITH DIFFERENTIAL (CANCER CENTER ONLY)
Abs Immature Granulocytes: 0.01 K/uL (ref 0.00–0.07)
Basophils Absolute: 0 K/uL (ref 0.0–0.1)
Basophils Relative: 1 %
Eosinophils Absolute: 0.2 K/uL (ref 0.0–0.5)
Eosinophils Relative: 5 %
HCT: 36.8 % (ref 36.0–46.0)
Hemoglobin: 12.4 g/dL (ref 12.0–15.0)
Immature Granulocytes: 0 %
Lymphocytes Relative: 28 %
Lymphs Abs: 1.5 K/uL (ref 0.7–4.0)
MCH: 31.7 pg (ref 26.0–34.0)
MCHC: 33.7 g/dL (ref 30.0–36.0)
MCV: 94.1 fL (ref 80.0–100.0)
Monocytes Absolute: 0.6 K/uL (ref 0.1–1.0)
Monocytes Relative: 11 %
Neutro Abs: 3 K/uL (ref 1.7–7.7)
Neutrophils Relative %: 55 %
Platelet Count: 192 K/uL (ref 150–400)
RBC: 3.91 MIL/uL (ref 3.87–5.11)
RDW: 13.3 % (ref 11.5–15.5)
WBC Count: 5.3 K/uL (ref 4.0–10.5)
nRBC: 0 % (ref 0.0–0.2)

## 2024-02-06 LAB — CMP (CANCER CENTER ONLY)
ALT: 14 U/L (ref 0–44)
AST: 22 U/L (ref 15–41)
Albumin: 4.4 g/dL (ref 3.5–5.0)
Alkaline Phosphatase: 85 U/L (ref 38–126)
Anion gap: 14 (ref 5–15)
BUN: 47 mg/dL — ABNORMAL HIGH (ref 8–23)
CO2: 23 mmol/L (ref 22–32)
Calcium: 9.8 mg/dL (ref 8.9–10.3)
Chloride: 99 mmol/L (ref 98–111)
Creatinine: 2.4 mg/dL — ABNORMAL HIGH (ref 0.44–1.00)
GFR, Estimated: 20 mL/min — ABNORMAL LOW (ref >60)
Glucose, Bld: 93 mg/dL (ref 70–99)
Potassium: 4.6 mmol/L (ref 3.5–5.1)
Sodium: 137 mmol/L (ref 135–145)
Total Bilirubin: 0.6 mg/dL (ref 0.0–1.2)
Total Protein: 7.1 g/dL (ref 6.5–8.1)

## 2024-02-06 LAB — LACTATE DEHYDROGENASE: LDH: 190 U/L (ref 98–192)

## 2024-02-06 NOTE — Progress Notes (Signed)
 [facility]  [address] [place], [place] [address] [phone]  Clinic Day: 02/06/24  Referring physician: Magdaline Debby HERO, MD  CHIEF COMPLAINT:  CC: Stage IIIB follicular lymphoma  Current Treatment: Observation   HISTORY OF PRESENT ILLNESS:  Sharon Griffin is a 78 y.o. 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 She presented with significant upper abdominal adenopathy, with associated weight loss and malnutrition.  She was initially treated on a clinical trial and received bendamustine, ofatumumab, and bortezomib.  She received 1 cycle with an excellent response, but had severe toxicities and required hospitalization for IV fluids and antiemetics.  Chemotherapy was discontinued, but she did well for over a year with no further therapy.  In August 2013, she had progressive disease and was treated with bendamustine and rituximab for 6 cycles completed in April 2014 with a good response.  She has not required treatment since then.  Colonoscopy and endoscopy in May 2016 revealed diverticulosis and a hiatal hernia.    She was lost to follow-up between March 2017 and October 2018.  She had multiple health issues during that interval, including back surgery followed by osteomyelitis and diskitis.  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  She apparently got to the point where she could not ambulate or think straight, and had a prolonged hospitalization in [facility] followed by a stay in rehabilitation. CT chest, abdomen and pelvis in April 2019 revealed stable retroperitoneal soft tissue mass.  CT was done without contrast due to chronic kidney disease.  Repeat CT imaging in June 2020 was again stable.  Mammogram in November 2020 did not reveal any evidence of malignancy.  CT head in December 2020 was stable with VP shunt in place.   CT chest, abdomen and pelvis in September 2021  revealed unchanged post treatment appearance of celiac axis/gastrohepatic ligament lymph nodes.  No other evidence of mass or lymphadenopathy in the chest, abdomen or pelvis. She had a Cologuard test which came back abnormal and was scheduled for colonoscopy with Dr. Bert. Review of her records shows she underwent colonoscopy in October and was found to have tubular adenomas of the rectum, and underwent transanal excision of this. Surgical pathology did not reveal any high-grade dysplasia or malignancy. She states she is now followed by Dr. Howell, nephrologist, in [place] for chronic kidney disease. Bilateral  Screening mammogram in January of 2022 did not reveal any evidence of malignancy. CT chest, abdomen and pelvis done in September 2022 revealed stable to mildly improved exam with no new or progressive findings. She has stable post treatment related changes about the celiac region, and stable appearance of gastric thickening, perhaps related to post treatment changes.  INTERVAL HISTORY:  Sharon Griffin is here for routine follow up for stage IIIB follicular lymphoma originally diagnosed in March 2012. She was treated with bendamustine and rituximab for 6 cycles. Patient states that she feels good and has no complaints. She informed me she recently had Shingles of the left face and eye that has since cleared up. She has a WBC of 5.3, hemoglobin of 12.4, and platelet count of 192,000. Her CMP was normal other than an elevated BUN of 47 improved  from 66, an elevated creatinine of 2.40 improved from 2.92. Her LDH is pending today. I will see her back in 1 year with CBC, CMP, LDH, and CT chest, abdomen, and pelvis without contrast. She denies  fever, chills, night sweats, or other signs of infection. She denies cardiorespiratory and gastrointestinal issues. She  denies pain. Her appetite is good and Her weight has decreased 2 pounds over last 2 weeks.  REVIEW OF SYSTEMS:and  Review of Systems   Constitutional: Negative.  Negative for appetite change, chills, diaphoresis, fatigue, fever and unexpected weight change.  HENT:  Negative.  Negative for hearing loss, lump/mass, mouth sores, nosebleeds, sore throat, tinnitus, trouble swallowing and voice change.   Eyes: Negative.  Negative for eye problems and icterus.  Respiratory: Negative.  Negative for chest tightness, cough, hemoptysis, shortness of breath and wheezing.   Cardiovascular: Negative.  Negative for chest pain, leg swelling and palpitations.  Gastrointestinal: Negative.  Negative for abdominal distention, abdominal pain, blood in stool, constipation, diarrhea, nausea, rectal pain and vomiting.  Endocrine: Negative.   Genitourinary: Negative.  Negative for bladder incontinence, difficulty urinating, dyspareunia, dysuria, frequency, hematuria, menstrual problem, nocturia, pelvic pain, vaginal bleeding and vaginal discharge.   Musculoskeletal: Negative.  Negative for arthralgias, back pain, flank pain, gait problem, myalgias, neck pain and neck stiffness.  Skin: Negative.  Negative for itching, rash and wound.  Neurological: Negative.  Negative for dizziness, extremity weakness, gait problem, headaches, light-headedness, numbness, seizures and speech difficulty.  Hematological: Negative.  Negative for adenopathy. Does not bruise/bleed easily.  Psychiatric/Behavioral: Negative.  Negative for confusion, decreased concentration, depression, sleep disturbance and suicidal ideas. The patient is not nervous/anxious.      VITALS:  Blood pressure (!) 113/59, pulse 65, temperature 98.2 F (36.8 C), temperature source Oral, resp. rate 18, height 5' 2 (1.575 m), weight 135 lb 12.8 oz (61.6 kg), SpO2 98%.  Wt Readings from Last 3 Encounters:  02/06/24 135 lb 12.8 oz (61.6 kg)  02/20/23 137 lb 11.2 oz (62.5 kg)  08/21/22 136 lb 9.6 oz (62 kg)    Body mass index is 24.84 kg/m.  Performance status (ECOG): 0 - Asymptomatic  PHYSICAL  EXAM:  Physical Exam Vitals and nursing note reviewed.  Constitutional:      General: She is not in acute distress.    Appearance: Normal appearance. She is normal weight. She is not ill-appearing, toxic-appearing or diaphoretic.  HENT:     Head: Normocephalic and atraumatic.     Right Ear: Tympanic membrane, ear canal and external ear normal. There is no impacted cerumen.     Left Ear: Tympanic membrane, ear canal and external ear normal. There is no impacted cerumen.     Nose: Nose normal. No congestion or rhinorrhea.     Mouth/Throat:     Mouth: Mucous membranes are moist.     Pharynx: Oropharynx is clear. No oropharyngeal exudate or posterior oropharyngeal erythema.  Eyes:     General: No scleral icterus.       Right eye: No discharge.        Left eye: No discharge.     Extraocular Movements: Extraocular movements intact.     Conjunctiva/sclera: Conjunctivae normal.     Pupils: Pupils are equal, round, and reactive to light.  Neck:     Vascular: No carotid bruit.  Cardiovascular:     Rate and Rhythm: Normal rate and regular rhythm.     Pulses: Normal pulses.     Heart sounds: Murmur (1/6 systolic murmur) heard.     No friction rub. No gallop.  Pulmonary:     Effort: Pulmonary effort is normal. No respiratory distress.     Breath sounds: Normal breath sounds. No stridor. No wheezing,  rhonchi or rales.  Chest:     Chest wall: No tenderness.     Comments:   Abdominal:     General: Bowel sounds are normal. There is no distension.     Palpations: Abdomen is soft. There is no hepatomegaly, splenomegaly or mass.     Tenderness: There is no abdominal tenderness. There is no right CVA tenderness, left CVA tenderness, guarding or rebound.     Hernia: No hernia is present.  Musculoskeletal:        General: No swelling, tenderness, deformity or signs of injury. Normal range of motion.     Cervical back: Normal range of motion and neck supple. No rigidity or tenderness.     Right  lower leg: No edema.     Left lower leg: No edema.  Lymphadenopathy:     Cervical: No cervical adenopathy.  Skin:    General: Skin is warm and dry.     Coloration: Skin is not jaundiced or pale.     Findings: No bruising, erythema, lesion or rash.  Neurological:     General: No focal deficit present.     Mental Status: She is alert and oriented to person, place, and time. Mental status is at baseline.     Cranial Nerves: No cranial nerve deficit.     Sensory: No sensory deficit.     Motor: No weakness.     Coordination: Coordination normal.     Gait: Gait normal.     Deep Tendon Reflexes: Reflexes normal.  Psychiatric:        Mood and Affect: Mood normal.        Behavior: Behavior normal.        Thought Content: Thought content normal.        Judgment: Judgment normal.    LABS:      Latest Ref Rng & Units 02/06/2024   10:30 AM 08/21/2022    1:18 PM 02/16/2022   12:00 AM  CBC  WBC 4.0 - 10.5 K/uL 5.3  5.4  4.5      Hemoglobin 12.0 - 15.0 g/dL 87.5  87.4  87.9      Hematocrit 36.0 - 46.0 % 36.8  38.4  35      Platelets 150 - 400 K/uL 192  188  183         This result is from an external source.      Latest Ref Rng & Units 02/06/2024   10:30 AM 08/21/2022    1:18 PM 02/16/2022   12:00 AM  CMP  Glucose 70 - 99 mg/dL 93  892    BUN 8 - 23 mg/dL 47  66  26      Creatinine 0.44 - 1.00 mg/dL 7.59  7.07  1.9      Sodium 135 - 145 mmol/L 137  137  136      Potassium 3.5 - 5.1 mmol/L 4.6  4.1  4.1      Chloride 98 - 111 mmol/L 99  99  104      CO2 22 - 32 mmol/L 23  28  26       Calcium 8.9 - 10.3 mg/dL 9.8  9.6  9.5      Total Protein 6.5 - 8.1 g/dL 7.1  8.0    Total Bilirubin 0.0 - 1.2 mg/dL 0.6  0.8    Alkaline Phos 38 - 126 U/L 85  68  76      AST 15 - 41 U/L  22  21  26       ALT 0 - 44 U/L 14  18  23          This result is from an external source.   Component Ref Range & Units 6 mo ago (08/21/22) 1 yr ago (08/21/21) 2 yr ago (02/01/21) 2 yr ago (08/02/20)  LDH 98 - 192  U/L 159 160 CM 497 R 169 CM   STUDIES:  Exam:  02/18/23 CT Chest, Abdomen, and Pelvis without Contrast Impression: Stable prominent soft tissue in the upper retroperitoneum presumably reflecting treated lymphoma. No new or progressive lymphadenopathy in the chest, abdomen, and pelvis. No splenomegaly. Aortic Atherosclerosis (ICD10-170.0)  HISTORY:   Allergies:  Allergies  Allergen Reactions   Azithromycin Hives    During hospital admission at [facility] 12/2015- med DC due to occurrence of questionable V-Tach versus Torsades.   Codeine Nausea And Vomiting and Rash    Unknown    Current Medications: Current Outpatient Medications  Medication Sig Dispense Refill   gabapentin (NEURONTIN) 100 MG capsule Take 100 mg by mouth as needed.     amLODipine (NORVASC) 10 MG tablet Take 10 mg by mouth daily.     atorvastatin (LIPITOR) 10 MG tablet Take 10 mg by mouth every evening.     calcium-vitamin D (OSCAL WITH D) 500-200 MG-UNIT TABS tablet Take 1 tablet by mouth 2 (two) times daily.     cholecalciferol (VITAMIN D) 1000 units tablet Take 1,000 Units by mouth at bedtime.     docusate sodium (COLACE) 100 MG capsule Take 100 mg by mouth every 12 (twelve) hours as needed for mild constipation.     escitalopram (LEXAPRO) 10 MG tablet Take 10 mg by mouth daily.     famotidine (PEPCID) 40 MG tablet Take 40 mg by mouth daily.     levothyroxine (SYNTHROID) 50 MCG tablet Take by mouth.     lidocaine -prilocaine (EMLA) cream Apply 1 application topically as needed (for port).     lisinopril (ZESTRIL) 5 MG tablet Take by mouth.     Multiple Vitamin (MULTI-VITAMINS) TABS Take by mouth.     ondansetron (ZOFRAN) 4 MG tablet Take 4 mg by mouth every 6 (six) hours as needed for nausea or vomiting.     pantoprazole (PROTONIX) 40 MG tablet Take 40 mg by mouth daily.     torsemide (DEMADEX) 5 MG tablet Take 5 mg by mouth daily.     No current facility-administered medications for this visit.      ASSESSMENT & PLAN:  Assessment:   History of low-grade follicular lymphoma, diagnosed in March 2012.  She has not required treatment in many years.  She remains without evidence of recurrent/progressive disease. CT of abdomen and pelvis in October 2024 revealed a stable retroperitoneal soft tissue mass consistent with treated lymphoma.  Chronic kidney disease, stable. She continues to follow with a nephrologist annually.  Hydrocephalus, this was diagnosed several years ago and treated with a VP shunt.  Plan:     Kirstie is here for routine follow up for stage IIIB follicular lymphoma originally diagnosed in March 2012. She was treated with bendamustine and rituximab for 6 cycles. Patient states that she feels good and has no complaints. She informed me she recently had Shingles of the left face and eye that has since cleared up. She has a WBC of 5.3, hemoglobin of 12.4, and platelet count of 192,000. Her CMP was normal other than an elevated BUN of 47 improved  from 66,  an elevated creatinine of 2.40 improved from 2.92. Her LDH is pending today. I will see her back in 1 year with CBC, CMP, LDH, and CT chest, abdomen, and pelvis without contrast. The patient understands the plans discussed today and is in agreement with them.  She knows to contact our office if she develops concerns prior to her next appointment.  I provided 12 minutes of face-to-face time during this this encounter and > 50% was spent counseling as documented under my assessment and plan.   Wanda VEAR Cornish, MD  [facility] DE[facility] OF [facility]. [facility] [address] [place] [address] Dept: [phone] Dept Fax: [phone]

## 2024-02-06 NOTE — Telephone Encounter (Signed)
 Patient has been scheduled for follow-up visit per 02/06/24 LOS.  Pt given an appt calendar with date and time.

## 2025-01-29 ENCOUNTER — Other Ambulatory Visit

## 2025-02-05 ENCOUNTER — Ambulatory Visit: Admitting: Oncology
